# Patient Record
Sex: Male | Born: 1975 | Race: Black or African American | Hispanic: No | Smoking: Former smoker
Health system: Southern US, Community
[De-identification: ages and names within clinical notes are randomized; demographics above are authoritative.]

## PROBLEM LIST (undated history)

## (undated) ENCOUNTER — Ambulatory Visit: Admission: EM

## (undated) ENCOUNTER — Emergency Department (HOSPITAL_COMMUNITY): Admission: EM | Payer: Self-pay

## (undated) ENCOUNTER — Emergency Department (HOSPITAL_COMMUNITY): Payer: No Typology Code available for payment source

## (undated) ENCOUNTER — Emergency Department

## (undated) DIAGNOSIS — I4891 Unspecified atrial fibrillation: Secondary | ICD-10-CM

## (undated) HISTORY — PX: ANKLE SURGERY: SHX546

---

## 1998-01-29 ENCOUNTER — Emergency Department (HOSPITAL_COMMUNITY): Admission: EM | Admit: 1998-01-29 | Discharge: 1998-01-29 | Payer: Self-pay | Admitting: Emergency Medicine

## 1998-09-22 ENCOUNTER — Emergency Department (HOSPITAL_COMMUNITY): Admission: EM | Admit: 1998-09-22 | Discharge: 1998-09-22 | Payer: Self-pay | Admitting: Emergency Medicine

## 1998-09-22 ENCOUNTER — Encounter: Payer: Self-pay | Admitting: Internal Medicine

## 1998-09-22 ENCOUNTER — Emergency Department (HOSPITAL_COMMUNITY): Admission: EM | Admit: 1998-09-22 | Discharge: 1998-09-22 | Payer: Self-pay | Admitting: Internal Medicine

## 1998-09-29 ENCOUNTER — Emergency Department (HOSPITAL_COMMUNITY): Admission: EM | Admit: 1998-09-29 | Discharge: 1998-09-29 | Payer: Self-pay | Admitting: Emergency Medicine

## 2001-07-25 ENCOUNTER — Emergency Department (HOSPITAL_COMMUNITY): Admission: EM | Admit: 2001-07-25 | Discharge: 2001-07-25 | Payer: Self-pay | Admitting: Emergency Medicine

## 2006-09-27 ENCOUNTER — Emergency Department (HOSPITAL_COMMUNITY): Admission: EM | Admit: 2006-09-27 | Discharge: 2006-09-27 | Payer: Self-pay | Admitting: Emergency Medicine

## 2006-09-29 ENCOUNTER — Emergency Department (HOSPITAL_COMMUNITY): Admission: EM | Admit: 2006-09-29 | Discharge: 2006-09-29 | Payer: Self-pay | Admitting: Emergency Medicine

## 2012-03-09 ENCOUNTER — Emergency Department (HOSPITAL_COMMUNITY)
Admission: EM | Admit: 2012-03-09 | Discharge: 2012-03-09 | Disposition: A | Discharge status: Home / Self Care | Payer: Self-pay | Attending: Emergency Medicine | Admitting: Emergency Medicine

## 2012-03-09 ENCOUNTER — Encounter (HOSPITAL_COMMUNITY): Payer: Self-pay

## 2012-03-09 DIAGNOSIS — M545 Low back pain, unspecified: Secondary | ICD-10-CM | POA: Insufficient documentation

## 2012-03-09 DIAGNOSIS — X500XXA Overexertion from strenuous movement or load, initial encounter: Secondary | ICD-10-CM | POA: Insufficient documentation

## 2012-03-09 DIAGNOSIS — F17200 Nicotine dependence, unspecified, uncomplicated: Secondary | ICD-10-CM | POA: Insufficient documentation

## 2012-03-09 MED ORDER — OXYCODONE-ACETAMINOPHEN 5-325 MG PO TABS: 1.0000 | ORAL_TABLET | ORAL | Status: AC | PRN

## 2012-03-09 MED ORDER — CYCLOBENZAPRINE HCL 10 MG PO TABS
5.0000 mg | ORAL_TABLET | Freq: Once | ORAL | Status: AC
  Administered 2012-03-09: 5 mg via ORAL
  Filled 2012-03-09: qty 1

## 2012-03-09 MED ORDER — CYCLOBENZAPRINE HCL 10 MG PO TABS: 10.0000 mg | ORAL_TABLET | Freq: Two times a day (BID) | ORAL | Status: AC | PRN

## 2012-03-09 MED ORDER — OXYCODONE-ACETAMINOPHEN 5-325 MG PO TABS
1.0000 | ORAL_TABLET | Freq: Once | ORAL | Status: AC
  Administered 2012-03-09: 1 via ORAL
  Filled 2012-03-09: qty 1

## 2012-03-09 NOTE — ED Provider Notes (Signed)
History     CSN: [id number]  Arrival date & time 03/09/12  1204   First MD Initiated Contact with Patient 03/09/12 1247      Chief Complaint  Patient presents with  . Back Pain    (Consider location/radiation/quality/duration/timing/severity/associated sxs/prior treatment) HPI  Patient was lifting some heavy boxes and then later in the afternoon noticed later that afternoon that he was really sore. He states that he had a hard time getting comfortable to sleep last night and then this morning his back was hurting much work and his job sent him home to rest. He denies having urinary or bowel incontinence. Denies numbness or tingling in the extremities. He is ambulatory. He is in NAD and VSS.  History reviewed. No pertinent past medical history.  Past Surgical History  Procedure Date  . Ankle surgery     History reviewed. No pertinent family history.  History  Substance Use Topics  . Smoking status: Current Everyday Smoker    Types: Cigarettes  . Smokeless tobacco: Never Used  . Alcohol Use: Yes     socially      Review of Systems   HEENT: denies blurry vision or change in hearing PULMONARY: Denies difficulty breathing and SOB CARDIAC: denies chest pain or heart palpitations MUSCULOSKELETAL:  denies being unable to ambulate ABDOMEN AL: denies abdominal pain GU: denies loss of bowel or urinary control NEURO: denies numbness and tingling in extremities SKIN: no new rashes PSYCH: patient behavior is normal NECK: Not complaining of neck pain     Allergies  Review of patient's allergies indicates no known allergies.  Home Medications   Current Outpatient Rx  Name Route Sig Dispense Refill  . CYCLOBENZAPRINE HCL 10 MG PO TABS Oral Take 1 tablet (10 mg total) by mouth 2 (two) times daily as needed for muscle spasms. 20 tablet 0  . OXYCODONE-ACETAMINOPHEN 5-325 MG PO TABS Oral Take 1 tablet by mouth every 4 (four) hours as needed for pain. 14 tablet 0    BP  135/81  Pulse 99  Temp 98.4 F (36.9 C) (Oral)  Resp 17  SpO2 96%  Physical Exam  Nursing note and vitals reviewed. Constitutional: He appears well-developed and well-nourished. No distress.  HENT:  Head: Normocephalic and atraumatic.  Eyes: Pupils are equal, round, and reactive to light.  Neck: Normal range of motion. Neck supple.  Cardiovascular: Normal rate and regular rhythm.   Pulmonary/Chest: Effort normal.  Abdominal: Soft.  Musculoskeletal:       Arms:       Equal strength to bilateral lower extremities. Neurosensory  function adequate to both legs. Skin color is normal. Skin is warm and moist. I see no step off deformity, no bony tenderness. Pt is able to ambulate without limp. Pain is relieved when sitting in certain positions. ROM is decreased due to pain. No crepitus, laceration, effusion, swelling.  Pulses are normal   Neurological: He is alert.  Skin: Skin is warm and dry.    ED Course  Procedures (including critical care time)  Labs Reviewed - No data to display No results found.   1. Low back pain       MDM  Patient with back pain. No neurological deficits. Patient is ambulatory. No warning symptoms of back pain including: loss of bowel or bladder control, night sweats, waking from sleep with back pain, unexplained fevers or weight loss, h/o cancer, IVDU, recent trauma. No concern for cauda equina, epidural abscess, or other serious cause of back  pain. Conservative measures such as rest, ice/heat and pain medicine indicated with PCP follow-up if no improvement with conservative management.           Dorthula Matas, PA 03/09/12 1313

## 2012-03-09 NOTE — ED Notes (Signed)
Patient c/o lower back pain tht beagan last night. Patient states he had done heavy lifting a couple of days ago, but no injuries. MAE.

## 2012-03-09 NOTE — Discharge Instructions (Signed)
Back Exercises Back exercises help treat and prevent back injuries. The goal of back exercises is to increase the strength of your abdominal and back muscles and the flexibility of your back. These exercises should be started when you no longer have back pain. Back exercises include:  Pelvic Tilt. Lie on your back with your knees bent. Tilt your pelvis until the lower part of your back is against the floor. Hold this position 5 to 10 sec and repeat 5 to 10 times.   Knee to Chest. Pull first 1 knee up against your chest and hold for 20 to 30 seconds, repeat this with the other knee, and then both knees. This may be done with the other leg straight or bent, whichever feels better.   Sit-Ups or Curl-Ups. Bend your knees 90 degrees. Start with tilting your pelvis, and do a partial, slow sit-up, lifting your trunk only 30 to 45 degrees off the floor. Take at least 2 to 3 seconds for each sit-up. Do not do sit-ups with your knees out straight. If partial sit-ups are difficult, simply do the above but with only tightening your abdominal muscles and holding it as directed.   Hip-Lift. Lie on your back with your knees flexed 90 degrees. Push down with your feet and shoulders as you raise your hips a couple inches off the floor; hold for 10 seconds, repeat 5 to 10 times.   Back arches. Lie on your stomach, propping yourself up on bent elbows. Slowly press on your hands, causing an arch in your low back. Repeat 3 to 5 times. Any initial stiffness and discomfort should lessen with repetition over time.   Shoulder-Lifts. Lie face down with arms beside your body. Keep hips and torso pressed to floor as you slowly lift your head and shoulders off the floor.  Do not overdo your exercises, especially in the beginning. Exercises may cause you some mild back discomfort which lasts for a few minutes; however, if the pain is more severe, or lasts for more than 15 minutes, do not continue exercises until you see your  caregiver. Improvement with exercise therapy for back problems is slow.  See your caregivers for assistance with developing a proper back exercise program. Document Released: 10/20/2004 Document Revised: 09/01/2011 Document Reviewed: 09/12/2005 [employer] Patient Information 2012 [employer].Back Pain, Adult Low back pain is very common. About 1 in 5 people have back pain.The cause of low back pain is rarely dangerous. The pain often gets better over time.About half of people with a sudden onset of back pain feel better in just 2 weeks. About 8 in 10 people feel better by 6 weeks.  CAUSES Some common causes of back pain include:  Strain of the muscles or ligaments supporting the spine.   Wear and tear (degeneration) of the spinal discs.   Arthritis.   Direct injury to the back.  DIAGNOSIS Most of the time, the direct cause of low back pain is not known.However, back pain can be treated effectively even when the exact cause of the pain is unknown.Answering your caregiver's questions about your overall health and symptoms is one of the most accurate ways to make sure the cause of your pain is not dangerous. If your caregiver needs more information, he or she may order lab work or imaging tests (X-rays or MRIs).However, even if imaging tests show changes in your back, this usually does not require surgery. HOME CARE INSTRUCTIONS For many people, back pain returns.Since low back pain is rarely   dangerous, it is often a condition that people can learn to manageon their own.   Remain active. It is stressful on the back to sit or stand in one place. Do not sit, drive, or stand in one place for more than 30 minutes at a time. Take short walks on level surfaces as soon as pain allows.Try to increase the length of time you walk each day.   Do not stay in bed.Resting more than 1 or 2 days can delay your recovery.   Do not avoid exercise or work.Your body is made to move.It is not dangerous  to be active, even though your back may hurt.Your back will likely heal faster if you return to being active before your pain is gone.   Pay attention to your body when you bend and lift. Many people have less discomfortwhen lifting if they bend their knees, keep the load close to their bodies,and avoid twisting. Often, the most comfortable positions are those that put less stress on your recovering back.   Find a comfortable position to sleep. Use a firm mattress and lie on your side with your knees slightly bent. If you lie on your back, put a pillow under your knees.   Only take over-the-counter or prescription medicines as directed by your caregiver. Over-the-counter medicines to reduce pain and inflammation are often the most helpful.Your caregiver may prescribe muscle relaxant drugs.These medicines help dull your pain so you can more quickly return to your normal activities and healthy exercise.   Put ice on the injured area.   Put ice in a plastic bag.   Place a towel between your skin and the bag.   Leave the ice on for 15 to 20 minutes, 3 to 4 times a day for the first 2 to 3 days. After that, ice and heat may be alternated to reduce pain and spasms.   Ask your caregiver about trying back exercises and gentle massage. This may be of some benefit.   Avoid feeling anxious or stressed.Stress increases muscle tension and can worsen back pain.It is important to recognize when you are anxious or stressed and learn ways to manage it.Exercise is a great option.  SEEK MEDICAL CARE IF:  You have pain that is not relieved with rest or medicine.   You have pain that does not improve in 1 week.   You have new symptoms.   You are generally not feeling well.  SEEK IMMEDIATE MEDICAL CARE IF:   You have pain that radiates from your back into your legs.   You develop new bowel or bladder control problems.   You have unusual weakness or numbness in your arms or legs.   You develop  nausea or vomiting.   You develop abdominal pain.   You feel faint.  Document Released: 09/12/2005 Document Revised: 09/01/2011 Document Reviewed: 01/31/2011 [employer] Patient Information 2012 [employer].Lumbosacral Strain Lumbosacral strain is one of the most common causes of back pain. There are many causes of back pain. Most are not serious conditions. CAUSES  Your backbone (spinal column) is made up of 24 main vertebral bodies, the sacrum, and the coccyx. These are held together by muscles and tough, fibrous tissue (ligaments). Nerve roots pass through the openings between the vertebrae. A sudden move or injury to the back may cause injury to, or pressure on, these nerves. This may result in localized back pain or pain movement (radiation) into the buttocks, down the leg, and into the foot. Sharp, shooting pain   from the buttock down the back of the leg (sciatica) is frequently associated with a ruptured (herniated) disk. Pain may be caused by muscle spasm alone. Your caregiver can often find the cause of your pain by the details of your symptoms and an exam. In some cases, you may need tests (such as X-rays). Your caregiver will work with you to decide if any tests are needed based on your specific exam. HOME CARE INSTRUCTIONS   Avoid an underactive lifestyle. Active exercise, as directed by your caregiver, is your greatest weapon against back pain.   Avoid hard physical activities (tennis, racquetball, waterskiing) if you are not in proper physical condition for it. This may aggravate or create problems.   If you have a back problem, avoid sports requiring sudden body movements. Swimming and walking are generally safer activities.   Maintain good posture.   Avoid becoming overweight (obese).   Use bed rest for only the most extreme, sudden (acute) episode. Your caregiver will help you determine how much bed rest is necessary.   For acute conditions, you may put ice on the injured  area.   Put ice in a plastic bag.   Place a towel between your skin and the bag.   Leave the ice on for 15 to 20 minutes at a time, every 2 hours, or as needed.   After you are improved and more active, it may help to apply heat for 30 minutes before activities.  See your caregiver if you are having pain that lasts longer than expected. Your caregiver can advise appropriate exercises or therapy if needed. With conditioning, most back problems can be avoided. SEEK IMMEDIATE MEDICAL CARE IF:   You have numbness, tingling, weakness, or problems with the use of your arms or legs.   You experience severe back pain not relieved with medicines.   There is a change in bowel or bladder control.   You have increasing pain in any area of the body, including your belly (abdomen).   You notice shortness of breath, dizziness, or feel faint.   You feel sick to your stomach (nauseous), are throwing up (vomiting), or become sweaty.   You notice discoloration of your toes or legs, or your feet get very cold.   Your back pain is getting worse.   You have a fever.  MAKE SURE YOU:   Understand these instructions.   Will watch your condition.   Will get help right away if you are not doing well or get worse.  Document Released: 06/22/2005 Document Revised: 09/01/2011 Document Reviewed: 12/12/2008 [employer] Patient Information 2012 [employer]. 

## 2012-03-10 NOTE — ED Provider Notes (Signed)
Medical screening examination/treatment/procedure(s) were performed by non-physician practitioner and as supervising physician I was immediately available for consultation/collaboration.  Juliet Rude. Rubin Payor, MD 03/10/12 [phone]

## 2012-10-03 ENCOUNTER — Encounter (HOSPITAL_COMMUNITY): Payer: Self-pay | Admitting: *Deleted

## 2012-10-03 ENCOUNTER — Emergency Department (INDEPENDENT_AMBULATORY_CARE_PROVIDER_SITE_OTHER)
Admission: EM | Admit: 2012-10-03 | Discharge: 2012-10-03 | Disposition: A | Discharge status: Home / Self Care | Payer: Self-pay | Source: Home / Self Care | Attending: Emergency Medicine | Admitting: Emergency Medicine

## 2012-10-03 DIAGNOSIS — B349 Viral infection, unspecified: Secondary | ICD-10-CM

## 2012-10-03 DIAGNOSIS — B9789 Other viral agents as the cause of diseases classified elsewhere: Secondary | ICD-10-CM

## 2012-10-03 MED ORDER — ACETAMINOPHEN 325 MG PO TABS
975.0000 mg | ORAL_TABLET | Freq: Once | ORAL | Status: AC
  Administered 2012-10-03: 975 mg via ORAL

## 2012-10-03 MED ORDER — ACETAMINOPHEN 500 MG PO TABS: 500.0000 mg | ORAL_TABLET | Freq: Four times a day (QID) | ORAL | Status: DC | PRN

## 2012-10-03 MED ORDER — ONDANSETRON HCL 4 MG PO TABS: 4.0000 mg | ORAL_TABLET | Freq: Three times a day (TID) | ORAL | Status: DC | PRN

## 2012-10-03 MED ORDER — ACETAMINOPHEN 325 MG PO TABS
ORAL_TABLET | ORAL | Status: AC
  Filled 2012-10-03: qty 3

## 2012-10-03 NOTE — ED Notes (Signed)
No vomiting while he was here.  Said he went to the [facility] and had a small amount of diarrhea after taking the Tylenol.

## 2012-10-03 NOTE — ED Notes (Signed)
Pt refused blood work  

## 2012-10-03 NOTE — ED Notes (Signed)
C/o head is woozy, diarrhea and vomting onset this AM.  States the back of his legs are cold (chills).  Pt. is febrile.  V x 1 and D x 1. C/o headache and " weak stomach" (nausea).  No cough.

## 2012-10-03 NOTE — ED Provider Notes (Signed)
andHistory     CSN: [id number]  Arrival date & time 10/03/12  1630   First MD Initiated Contact with Patient 10/03/12 1640      Chief Complaint  Patient presents with  . Influenza    (Consider location/radiation/quality/duration/timing/severity/associated sxs/prior treatment) HPI Comments: Patient presents urgent care describing that his head feels woozy has been having diarrheas and vomiting since this a.m. Patient had vomited once and had had to 3 episodes of diarrhea. He described both of his legs have been hurting and feel cold. Patient denies any cough or upper congestion, and no shortness of breath.  " I just feel weak and my legs are hurting"  Patient at this moment denies any abdominal pain, denies feeling nauseous. Does have a headache he describes.  Patient is a 37 y.o. male presenting with flu symptoms. The history is provided by the patient.  Influenza This is a new problem. The current episode started 6 to 12 hours ago. The problem occurs constantly. The problem has not changed since onset.Associated symptoms include headaches. Pertinent negatives include no chest pain, no abdominal pain and no shortness of breath. Nothing relieves the symptoms. Treatments tried: Took a cough syrup last night, and also took an antibiotic dose ( unknown leftover)    History reviewed. No pertinent past medical history.  Past Surgical History  Procedure Date  . Ankle surgery     History reviewed. No pertinent family history.  History  Substance Use Topics  . Smoking status: Current Every Day Smoker -- 0.5 packs/day    Types: Cigarettes  . Smokeless tobacco: Never Used  . Alcohol Use: 3.0 oz/week    5 Shots of liquor per week     Comment: socially      Review of Systems  Constitutional: Positive for fever, chills, diaphoresis, activity change, appetite change and fatigue. Negative for unexpected weight change.  HENT: Negative for congestion, facial swelling, rhinorrhea, neck  pain and neck stiffness.   Eyes: Negative for photophobia and visual disturbance.  Respiratory: Negative for shortness of breath.   Cardiovascular: Negative for chest pain.  Gastrointestinal: Positive for nausea and vomiting. Negative for abdominal pain.  Genitourinary: Negative for dysuria.  Skin: Negative for rash.  Neurological: Positive for weakness, light-headedness and headaches. Negative for tremors, seizures, syncope, speech difficulty and numbness.    Allergies  Review of patient's allergies indicates no known allergies.  Home Medications   Current Outpatient Rx  Name  Route  Sig  Dispense  Refill  . ACETAMINOPHEN 500 MG PO TABS   Oral   Take 1 tablet (500 mg total) by mouth every 6 (six) hours as needed for pain.   15 tablet   0   . ONDANSETRON HCL 4 MG PO TABS   Oral   Take 1 tablet (4 mg total) by mouth every 8 (eight) hours as needed for nausea.   10 tablet   0     BP 123/75  Pulse 112  Temp 100.6 F (38.1 C) (Oral)  Resp 20  SpO2 98%  Physical Exam  Constitutional: Vital signs are normal.  Non-toxic appearance. He does not have a sickly appearance. He does not appear ill. No distress.  HENT:  Head: Normocephalic.  Mouth/Throat: Uvula is midline, oropharynx is clear and moist and mucous membranes are normal.  Eyes: Conjunctivae normal are normal. Right eye exhibits no discharge.  Pulmonary/Chest: Effort normal and breath sounds normal. He has no decreased breath sounds.  Abdominal: Soft. He exhibits no distension and  no mass. There is no tenderness. There is no rebound and no guarding.  Neurological: He is alert.  Skin: Skin is warm. No rash noted. He is not diaphoretic. No erythema. No pallor.    ED Course  Procedures (including critical care time)  Labs Reviewed - No data to display No results found.   1. Viral syndrome     EKG sinus tachycardia of 111. No ST or T wave changes. Normal PR interval and QRS duration.  MDM  Patient with mild  gastrointestinal symptoms. It looks comfortable mild tachycardia. Low-grade temperature. Patient looks comfortable in no respiratory distress soft abdomen well hydrated we'll pursue treatment as a viral syndrome have encouraged patient to  to return if new or worsening symptoms. Patient refused blood work while at urgent care. Patient was prescribed Tylenol and Zofran for nausea vomiting.   Jimmie Molly, MD 10/03/12 [phone]

## 2013-04-06 ENCOUNTER — Encounter (HOSPITAL_COMMUNITY): Payer: Self-pay | Admitting: *Deleted

## 2013-04-06 ENCOUNTER — Emergency Department (HOSPITAL_COMMUNITY)
Admission: EM | Admit: 2013-04-06 | Discharge: 2013-04-06 | Disposition: A | Discharge status: Home / Self Care | Payer: Self-pay | Attending: Emergency Medicine | Admitting: Emergency Medicine

## 2013-04-06 DIAGNOSIS — F17200 Nicotine dependence, unspecified, uncomplicated: Secondary | ICD-10-CM | POA: Insufficient documentation

## 2013-04-06 DIAGNOSIS — R1031 Right lower quadrant pain: Secondary | ICD-10-CM

## 2013-04-06 DIAGNOSIS — R109 Unspecified abdominal pain: Secondary | ICD-10-CM | POA: Insufficient documentation

## 2013-04-06 LAB — CBC WITH DIFFERENTIAL/PLATELET
Eosinophils Absolute: 0.2 10*3/uL (ref 0.0–0.7)
Eosinophils Relative: 2 % (ref 0–5)
Lymphs Abs: 3.2 10*3/uL (ref 0.7–4.0)
MCH: 31.9 pg (ref 26.0–34.0)
MCV: 87.9 fL (ref 78.0–100.0)
Platelets: 219 10*3/uL (ref 150–400)
RBC: 5.04 MIL/uL (ref 4.22–5.81)
RDW: 13.6 % (ref 11.5–15.5)

## 2013-04-06 LAB — COMPREHENSIVE METABOLIC PANEL
ALT: 41 U/L (ref 0–53)
Calcium: 9.1 mg/dL (ref 8.4–10.5)
Creatinine, Ser: 1.36 mg/dL — ABNORMAL HIGH (ref 0.50–1.35)
GFR calc Af Amer: 76 mL/min — ABNORMAL LOW (ref >90)
Glucose, Bld: 89 mg/dL (ref 70–99)
Sodium: 139 mEq/L (ref 135–145)
Total Protein: 7.3 g/dL (ref 6.0–8.3)

## 2013-04-06 NOTE — ED Provider Notes (Signed)
Medical screening examination/treatment/procedure(s) were performed by non-physician practitioner and as supervising physician I was immediately available for consultation/collaboration.  Jones Skene, M.D.     Jones Skene, MD 04/06/13 [phone]

## 2013-04-06 NOTE — ED Notes (Signed)
Pt states that he has been running errands all day and when he got home to rest he sat down and felt a sharp pain 8/10 in his right lower quadrant.pt states that he laid down and he saw a cramp in his stomach move with the pain and then the pain went away. Pt states when he presses on the area the pain gets worse and when he bends forward the pain get worse. Pt states that if he sits still the pain decreases to a 2/10.

## 2013-04-06 NOTE — ED Provider Notes (Signed)
History    CSN: [id number] Arrival date & time 04/06/13  0016  First MD Initiated Contact with Patient 04/06/13 0052     Chief Complaint  Patient presents with  . Abdominal Pain   HPI  History provided by the patient. Patient is a 37 year old male with no significant PMH who presents with complaints of right lower abdominal pains. Patient states that he first began to have some pains and discomforts 2 days ago after helping a friend move from his apartment. Patient did not think much of his symptoms as they were very intermittent and mild. Today patient states he was very busy and running several errands to plan for his daughter's one-year birthday. While bending over to the left side and picking up some of the supplies for an inflatable jump around the patient had sudden sharp pain to his right lower abdomen and side. This caused him to suddenly gasp for air and he had to wait several minutes for pain to improve. Since that time he has had pain anytime he moves at the waist area. Pain is much better while sitting and resting. He has not used any treatment for the symptoms. Pain does not radiate. He denies any weakness numbness in extremities. No back pain. No urinary or fecal incontinence.   History reviewed. No pertinent past medical history. Past Surgical History  Procedure Laterality Date  . Ankle surgery     History reviewed. No pertinent family history. History  Substance Use Topics  . Smoking status: Current Every Day Smoker -- 0.50 packs/day    Types: Cigarettes  . Smokeless tobacco: Never Used  . Alcohol Use: 3.0 oz/week    5 Shots of liquor per week     Comment: socially    Review of Systems  Constitutional: Negative for fever, chills, diaphoresis and appetite change.  Respiratory: Negative for shortness of breath.   Cardiovascular: Negative for chest pain.  Gastrointestinal: Positive for abdominal pain. Negative for nausea, vomiting, diarrhea and constipation.    Genitourinary: Negative for dysuria, frequency, hematuria and flank pain.  Musculoskeletal: Negative for back pain.  All other systems reviewed and are negative.    Allergies  Review of patient's allergies indicates no known allergies.  Home Medications   Current Outpatient Rx  Name  Route  Sig  Dispense  Refill  . acetaminophen (TYLENOL) 500 MG tablet   Oral   Take 1 tablet (500 mg total) by mouth every 6 (six) hours as needed for pain.   15 tablet   0   . ondansetron (ZOFRAN) 4 MG tablet   Oral   Take 1 tablet (4 mg total) by mouth every 8 (eight) hours as needed for nausea.   10 tablet   0    BP 133/90  Pulse 92  Temp(Src) 98.5 F (36.9 C) (Oral)  Resp 16  SpO2 96% Physical Exam  Nursing note and vitals reviewed. Constitutional: He is oriented to person, place, and time. He appears well-developed and well-nourished. No distress.  HENT:  Head: Normocephalic.  Eyes: Conjunctivae are normal.  Cardiovascular: Normal rate and regular rhythm.   No murmur heard. Pulmonary/Chest: Effort normal and breath sounds normal. No respiratory distress.  Abdominal: Soft. There is tenderness in the right upper quadrant. There is no rigidity, no rebound, no guarding, no CVA tenderness, no tenderness at McBurney's point and negative Murphy's sign. No hernia.    Pain along the right lower abdominal wall. There is no deformities. Pain does extend into the anterior  iliac crest area. No gross deformities. No concerning hernias. Skin is normal.  Musculoskeletal: Normal range of motion.  Neurological: He is alert and oriented to person, place, and time.  Skin: Skin is warm. No rash noted. No erythema.  Psychiatric: He has a normal mood and affect. His behavior is normal.    ED Course  Procedures   Results for orders placed during the hospital encounter of 04/06/13  CBC WITH DIFFERENTIAL      Result Value Range   WBC 6.6  4.0 - 10.5 K/uL   RBC 5.04  4.22 - 5.81 MIL/uL   Hemoglobin  16.1  13.0 - 17.0 g/dL   HCT 09.8  11.9 - 14.7 %   MCV 87.9  78.0 - 100.0 fL   MCH 31.9  26.0 - 34.0 pg   MCHC 36.3 (*) 30.0 - 36.0 g/dL   RDW 82.9  56.2 - 13.0 %   Platelets 219  150 - 400 K/uL   Neutrophils Relative % 40 (*) 43 - 77 %   Neutro Abs 2.7  1.7 - 7.7 K/uL   Lymphocytes Relative 49 (*) 12 - 46 %   Lymphs Abs 3.2  0.7 - 4.0 K/uL   Monocytes Relative 8  3 - 12 %   Monocytes Absolute 0.5  0.1 - 1.0 K/uL   Eosinophils Relative 2  0 - 5 %   Eosinophils Absolute 0.2  0.0 - 0.7 K/uL   Basophils Relative 1  0 - 1 %   Basophils Absolute 0.0  0.0 - 0.1 K/uL  COMPREHENSIVE METABOLIC PANEL      Result Value Range   Sodium 139  135 - 145 mEq/L   Potassium 3.6  3.5 - 5.1 mEq/L   Chloride 102  96 - 112 mEq/L   CO2 26  19 - 32 mEq/L   Glucose, Bld 89  70 - 99 mg/dL   BUN 21  6 - 23 mg/dL   Creatinine, Ser 8.65 (*) 0.50 - 1.35 mg/dL   Calcium 9.1  8.4 - 78.4 mg/dL   Total Protein 7.3  6.0 - 8.3 g/dL   Albumin 4.2  3.5 - 5.2 g/dL   AST 36  0 - 37 U/L   ALT 41  0 - 53 U/L   Alkaline Phosphatase 63  39 - 117 U/L   Total Bilirubin 0.5  0.3 - 1.2 mg/dL   GFR calc non Af Amer 65 (*) >90 mL/min   GFR calc Af Amer 76 (*) >90 mL/min        1. Abdominal wall pain in right lower quadrant     MDM  1:00AM patient seen and evaluated. Patient appears in no acute distress.  Patient with right lower abdominal pains worse with movements and flexion of abdominal wall muscles. He has normal WBC. No fever. He has no pain at rest. At this time I have low clinical suspicion for acute appendicitis. I did discuss this with the patient is a possibility however at this time he does not wish to have any further testing or CAT scan. He prefers to monitor symptoms at home and return if needed for continuous worsened pain. At this time we'll recommend conservative treatments.  Angus Seller, PA-C 04/06/13 [phone]

## 2014-02-16 ENCOUNTER — Emergency Department (HOSPITAL_COMMUNITY)
Admission: EM | Admit: 2014-02-16 | Discharge: 2014-02-16 | Disposition: A | Discharge status: Home / Self Care | Payer: Self-pay | Attending: Emergency Medicine | Admitting: Emergency Medicine

## 2014-02-16 ENCOUNTER — Encounter (HOSPITAL_COMMUNITY): Payer: Self-pay | Admitting: Emergency Medicine

## 2014-02-16 DIAGNOSIS — M7981 Nontraumatic hematoma of soft tissue: Secondary | ICD-10-CM | POA: Insufficient documentation

## 2014-02-16 DIAGNOSIS — T148XXA Other injury of unspecified body region, initial encounter: Secondary | ICD-10-CM

## 2014-02-16 DIAGNOSIS — F17200 Nicotine dependence, unspecified, uncomplicated: Secondary | ICD-10-CM | POA: Insufficient documentation

## 2014-02-16 NOTE — ED Notes (Signed)
Pt states he awoke tonight with lower left jaw pain.  Hematoma noted not the inside of pt left cheek

## 2014-02-16 NOTE — ED Provider Notes (Signed)
CSN: [id number]     Arrival date & time 02/16/14  7169 History   First MD Initiated Contact with Patient 02/16/14 0542     Chief Complaint  Patient presents with  . Dental Pain      HPI  Patient awakened early this morning with discomfort in his left cheek. He looked in the mirror  and inside of his mouth he saw a large swollen purple area. He became concerned that this might be an infection.  History reviewed. No pertinent past medical history. Past Surgical History  Procedure Laterality Date  . Ankle surgery     No family history on file. History  Substance Use Topics  . Smoking status: Current Every Day Smoker -- 0.50 packs/day    Types: Cigarettes  . Smokeless tobacco: Never Used  . Alcohol Use: 3.0 oz/week    5 Shots of liquor per week     Comment: socially    Review of Systems  HENT:       Pain in the inside of his left cheek. No pain or swelling over the parotid gland. No swelling to the floor of the mouth. No neck swelling. No dental pain.      Allergies  Review of patient's allergies indicates no known allergies.  Home Medications   Prior to Admission medications   Not on File   BP 136/91  Pulse 107  Temp(Src) 98.1 F (36.7 C) (Oral)  Resp 18  Ht 6\' 1"  (1.854 m)  Wt 220 lb (99.791 kg)  BMI 29.03 kg/m2  SpO2 98% Physical Exam  Constitutional:  Adult male upright in no acute distress.  HENT:  Mouth/Throat:    Neck:  No swelling or induration to the neck. No adenopathy in the neck.    ED Course  Procedures (including critical care time) Labs Review Labs Reviewed - No data to display  Imaging Review No results found.   EKG Interpretation None      MDM   Final diagnoses:  Hematoma    No specific treatment noted. Advised him not to consume any food today that he has to chew. Salt water or ice water swish and spit.    Tanna Furry, MD 02/16/14 [phone]

## 2014-02-16 NOTE — Discharge Instructions (Signed)
Avoid food that you have to chew until hematoma is smaller.  Ice water swish and spit.

## 2014-05-09 ENCOUNTER — Emergency Department (HOSPITAL_COMMUNITY): Payer: No Typology Code available for payment source

## 2014-05-09 ENCOUNTER — Emergency Department (HOSPITAL_COMMUNITY)
Admission: EM | Admit: 2014-05-09 | Discharge: 2014-05-09 | Disposition: A | Discharge status: Home / Self Care | Payer: No Typology Code available for payment source | Attending: Emergency Medicine | Admitting: Emergency Medicine

## 2014-05-09 DIAGNOSIS — Z79899 Other long term (current) drug therapy: Secondary | ICD-10-CM | POA: Insufficient documentation

## 2014-05-09 DIAGNOSIS — IMO0002 Reserved for concepts with insufficient information to code with codable children: Secondary | ICD-10-CM | POA: Diagnosis present

## 2014-05-09 DIAGNOSIS — IMO0001 Reserved for inherently not codable concepts without codable children: Secondary | ICD-10-CM | POA: Insufficient documentation

## 2014-05-09 DIAGNOSIS — F17200 Nicotine dependence, unspecified, uncomplicated: Secondary | ICD-10-CM | POA: Insufficient documentation

## 2014-05-09 DIAGNOSIS — Y92410 Unspecified street and highway as the place of occurrence of the external cause: Secondary | ICD-10-CM | POA: Insufficient documentation

## 2014-05-09 DIAGNOSIS — Y9389 Activity, other specified: Secondary | ICD-10-CM | POA: Insufficient documentation

## 2014-05-09 MED ORDER — TRAMADOL HCL 50 MG PO TABS
50.0000 mg | ORAL_TABLET | Freq: Once | ORAL | Status: AC
  Administered 2014-05-09: 50 mg via ORAL
  Filled 2014-05-09: qty 1

## 2014-05-09 MED ORDER — METHOCARBAMOL 500 MG PO TABS: 500.0000 mg | ORAL_TABLET | Freq: Two times a day (BID) | ORAL | Status: DC

## 2014-05-09 MED ORDER — TRAMADOL HCL 50 MG PO TABS: 50.0000 mg | ORAL_TABLET | Freq: Four times a day (QID) | ORAL | Status: DC | PRN
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
## 2014-05-09 NOTE — ED Notes (Signed)
Patient transported to X-ray 
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
## 2014-05-11 NOTE — ED Provider Notes (Signed)
Medical screening examination/treatment/procedure(s) were performed by non-physician practitioner and as supervising physician I was immediately available for consultation/collaboration.   EKG Interpretation None       Virgel Manifold, MD 05/11/14 [phone]

## 2016-06-03 ENCOUNTER — Ambulatory Visit (HOSPITAL_COMMUNITY)
Admission: EM | Admit: 2016-06-03 | Discharge: 2016-06-03 | Disposition: A | Discharge status: Home / Self Care | Payer: Self-pay | Attending: Physician Assistant | Admitting: Physician Assistant

## 2016-06-03 ENCOUNTER — Encounter (HOSPITAL_COMMUNITY): Payer: Self-pay | Admitting: Emergency Medicine

## 2016-06-03 DIAGNOSIS — R0789 Other chest pain: Secondary | ICD-10-CM

## 2016-06-03 DIAGNOSIS — T148 Other injury of unspecified body region: Secondary | ICD-10-CM

## 2016-06-03 DIAGNOSIS — T148XXA Other injury of unspecified body region, initial encounter: Secondary | ICD-10-CM

## 2016-06-03 MED ORDER — DICLOFENAC SODIUM 1 % TD GEL: 4.0000 g | Freq: Four times a day (QID) | TRANSDERMAL | 2 refills | Status: DC

## 2016-06-03 MED ORDER — OXYCODONE-ACETAMINOPHEN 10-325 MG PO TABS: 1.0000 | ORAL_TABLET | ORAL | 0 refills | Status: DC | PRN

## 2016-06-03 NOTE — ED Provider Notes (Signed)
CSN: [id number]     Arrival date & time 06/03/16  1211 History   First MD Initiated Contact with Patient 06/03/16 1246     Chief Complaint  Patient presents with  . Chest Pain   (Consider location/radiation/quality/duration/timing/severity/associated sxs/prior Treatment) HPI History obtained from patient:  Pt presents with the cc of:  Left chest wall pain Duration of symptoms: 3 days Treatment prior to arrival: Over-the-counter medications Context: Lifting heavy crates at work felt a muscle pull in his left upper chest Other symptoms include: No other symptoms except pain with motion of the left arm Pain score: 4 are 5 FAMILY HISTORY: Hypertension    History reviewed. No pertinent past medical history. Past Surgical History:  Procedure Laterality Date  . ANKLE SURGERY     History reviewed. No pertinent family history. Social History  Substance Use Topics  . Smoking status: Current Every Day Smoker    Packs/day: 0.50    Types: Cigarettes  . Smokeless tobacco: Never Used  . Alcohol use 3.0 oz/week    5 Shots of liquor per week     Comment: socially    Review of Systems  Denies: HEADACHE, NAUSEA, ABDOMINAL PAIN, CHEST PAIN, CONGESTION, DYSURIA, SHORTNESS OF BREATH  Allergies  Review of patient's allergies indicates no known allergies.  Home Medications   Prior to Admission medications   Medication Sig Start Date End Date Taking? Authorizing Provider  diclofenac sodium (VOLTAREN) 1 % GEL Apply 4 g topically 4 (four) times daily. 06/03/16   Konrad Felix, PA  methocarbamol (ROBAXIN) 500 MG tablet Take 1 tablet (500 mg total) by mouth 2 (two) times daily. 05/09/14   Jennifer Piepenbrink, PA-C  oxyCODONE-acetaminophen (PERCOCET) 10-325 MG tablet Take 1 tablet by mouth every 4 (four) hours as needed for pain. 06/03/16   Konrad Felix, PA  traMADol (ULTRAM) 50 MG tablet Take 1 tablet (50 mg total) by mouth every 6 (six) hours as needed. 05/09/14   Baron Sane, PA-C    Meds Ordered and Administered this Visit  Medications - No data to display  BP 118/91 (BP Location: Left Arm)   Pulse 78   Temp 98 F (36.7 C) (Oral)   Resp 12   SpO2 100%  No data found.   Physical Exam NURSES NOTES AND VITAL SIGNS REVIEWED. CONSTITUTIONAL: Well developed, well nourished, no acute distress HEENT: normocephalic, atraumatic EYES: Conjunctiva normal NECK:normal ROM, supple, no adenopathy PULMONARY:No respiratory distress, normal effort Left chest wall tender to palpation. Increased discomfort with motion of the left shoulder or arm. No changes of decreased breath sounds. ABDOMINAL: Soft, ND, NT BS+, No CVAT MUSCULOSKELETAL: Normal ROM of all extremities,  SKIN: warm and dry without rash PSYCHIATRIC: Mood and affect, behavior are normal  Urgent Care Course   Clinical Course    Procedures (including critical care time)  Labs Review Labs Reviewed - No data to display  Imaging Review No results found.   Visual Acuity Review  Right Eye Distance:   Left Eye Distance:   Bilateral Distance:    Right Eye Near:   Left Eye Near:    Bilateral Near:         MDM   1. Chest wall pain   2. Pulled muscle     Patient is reassured that there are no issues that require transfer to higher level of care at this time or additional tests. Patient is advised to continue home symptomatic treatment. Patient is advised that if there are new or worsening symptoms  to attend the emergency department, contact primary care provider, or return to UC. Instructions of care provided discharged home in stable condition.    THIS NOTE WAS GENERATED USING A VOICE RECOGNITION SOFTWARE PROGRAM. ALL REASONABLE EFFORTS  WERE MADE TO PROOFREAD THIS DOCUMENT FOR ACCURACY.  I have verbally reviewed the discharge instructions with the patient. A printed AVS was given to the patient.  All questions were answered prior to discharge.      Konrad Felix, [place] 06/03/16 1549

## 2016-06-03 NOTE — ED Triage Notes (Signed)
Here for left sided constant CP onset x2 days  Reports pain increases w/activity.... Has been lifting heavy boxes/crates at work  Denies SOB/dyspnea, diaphoresis, weakness, nausea  Smokes 0.5 PPD  States he did not go to work due to pain  A&O x4.. NAD

## 2017-01-16 ENCOUNTER — Encounter (HOSPITAL_COMMUNITY): Payer: Self-pay | Admitting: Emergency Medicine

## 2017-01-16 ENCOUNTER — Emergency Department (HOSPITAL_COMMUNITY)
Admission: EM | Admit: 2017-01-16 | Discharge: 2017-01-16 | Disposition: A | Discharge status: Home / Self Care | Payer: No Typology Code available for payment source | Attending: Emergency Medicine | Admitting: Emergency Medicine

## 2017-01-16 ENCOUNTER — Emergency Department (HOSPITAL_COMMUNITY): Payer: No Typology Code available for payment source

## 2017-01-16 DIAGNOSIS — S301XXA Contusion of abdominal wall, initial encounter: Secondary | ICD-10-CM | POA: Insufficient documentation

## 2017-01-16 DIAGNOSIS — S20211A Contusion of right front wall of thorax, initial encounter: Secondary | ICD-10-CM | POA: Insufficient documentation

## 2017-01-16 DIAGNOSIS — Y92410 Unspecified street and highway as the place of occurrence of the external cause: Secondary | ICD-10-CM | POA: Diagnosis not present

## 2017-01-16 DIAGNOSIS — S299XXA Unspecified injury of thorax, initial encounter: Secondary | ICD-10-CM | POA: Diagnosis present

## 2017-01-16 DIAGNOSIS — Y999 Unspecified external cause status: Secondary | ICD-10-CM | POA: Insufficient documentation

## 2017-01-16 DIAGNOSIS — F17210 Nicotine dependence, cigarettes, uncomplicated: Secondary | ICD-10-CM | POA: Diagnosis not present

## 2017-01-16 DIAGNOSIS — Y939 Activity, unspecified: Secondary | ICD-10-CM | POA: Insufficient documentation

## 2017-01-16 LAB — I-STAT CHEM 8, ED
BUN: 23 mg/dL — ABNORMAL HIGH (ref 6–20)
CREATININE: 1.1 mg/dL (ref 0.61–1.24)
Calcium, Ion: 1.11 mmol/L — ABNORMAL LOW (ref 1.15–1.40)
Chloride: 106 mmol/L (ref 101–111)
GLUCOSE: 83 mg/dL (ref 65–99)
HCT: 48 % (ref 39.0–52.0)
HEMOGLOBIN: 16.3 g/dL (ref 13.0–17.0)
Potassium: 4.4 mmol/L (ref 3.5–5.1)
Sodium: 139 mmol/L (ref 135–145)
TCO2: 29 mmol/L (ref 0–100)

## 2017-01-16 MED ORDER — HYDROCODONE-ACETAMINOPHEN 5-325 MG PO TABS
1.0000 | ORAL_TABLET | Freq: Once | ORAL | Status: AC
  Administered 2017-01-16: 1 via ORAL
  Filled 2017-01-16: qty 1

## 2017-01-16 MED ORDER — MORPHINE SULFATE (PF) 4 MG/ML IV SOLN
4.0000 mg | Freq: Once | INTRAVENOUS | Status: AC
Start: 2017-01-16 — End: 2017-01-16
  Administered 2017-01-16: 4 mg via INTRAVENOUS
  Filled 2017-01-16: qty 1

## 2017-01-16 MED ORDER — CYCLOBENZAPRINE HCL 10 MG PO TABS
10.0000 mg | ORAL_TABLET | Freq: Once | ORAL | Status: AC
  Administered 2017-01-16: 10 mg via ORAL
  Filled 2017-01-16: qty 1

## 2017-01-16 MED ORDER — IOPAMIDOL (ISOVUE-300) INJECTION 61%
INTRAVENOUS | Status: AC
  Administered 2017-01-16: 100 mL
  Filled 2017-01-16: qty 100

## 2017-01-16 MED ORDER — NAPROXEN 375 MG PO TABS: 375.0000 mg | ORAL_TABLET | Freq: Two times a day (BID) | ORAL | 0 refills | Status: DC

## 2017-01-16 MED ORDER — HYDROCODONE-ACETAMINOPHEN 5-325 MG PO TABS: 1.0000 | ORAL_TABLET | Freq: Four times a day (QID) | ORAL | 0 refills | Status: DC | PRN

## 2017-01-16 MED ORDER — ONDANSETRON HCL 4 MG/2ML IJ SOLN
4.0000 mg | Freq: Once | INTRAMUSCULAR | Status: AC
  Administered 2017-01-16: 4 mg via INTRAVENOUS
  Filled 2017-01-16: qty 2

## 2017-01-16 MED ORDER — CYCLOBENZAPRINE HCL 10 MG PO TABS: 10.0000 mg | ORAL_TABLET | Freq: Two times a day (BID) | ORAL | 0 refills | Status: DC | PRN
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
## 2017-01-16 NOTE — ED Notes (Signed)
Patient transported to CT 

## 2017-01-16 NOTE — ED Notes (Signed)
Pt complains of right side rib pain. Pt has full movement of upper and lower extremities.
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
## 2017-01-31 ENCOUNTER — Ambulatory Visit (HOSPITAL_COMMUNITY)
Admission: EM | Admit: 2017-01-31 | Discharge: 2017-01-31 | Disposition: A | Discharge status: Home / Self Care | Payer: Self-pay | Attending: Internal Medicine | Admitting: Internal Medicine

## 2017-01-31 ENCOUNTER — Ambulatory Visit (INDEPENDENT_AMBULATORY_CARE_PROVIDER_SITE_OTHER): Payer: Self-pay

## 2017-01-31 ENCOUNTER — Encounter (HOSPITAL_COMMUNITY): Payer: Self-pay | Admitting: Emergency Medicine

## 2017-01-31 DIAGNOSIS — R0789 Other chest pain: Secondary | ICD-10-CM

## 2017-01-31 DIAGNOSIS — R05 Cough: Secondary | ICD-10-CM | POA: Insufficient documentation

## 2017-01-31 DIAGNOSIS — J90 Pleural effusion, not elsewhere classified: Secondary | ICD-10-CM

## 2017-01-31 NOTE — Discharge Instructions (Signed)
Return if any problems.  Use spirometer to take deep breaths.

## 2017-01-31 NOTE — ED Notes (Signed)
Incentive    Spirometry      Instructed  To  Pt        With  Return   Demonstration
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
## 2019-01-17 ENCOUNTER — Other Ambulatory Visit: Payer: Self-pay

## 2019-01-17 ENCOUNTER — Emergency Department (HOSPITAL_COMMUNITY)
Admission: EM | Admit: 2019-01-17 | Discharge: 2019-01-17 | Disposition: A | Discharge status: Home / Self Care | Payer: Self-pay | Attending: Emergency Medicine | Admitting: Emergency Medicine

## 2019-01-17 DIAGNOSIS — R030 Elevated blood-pressure reading, without diagnosis of hypertension: Secondary | ICD-10-CM | POA: Insufficient documentation

## 2019-01-17 DIAGNOSIS — R6889 Other general symptoms and signs: Secondary | ICD-10-CM

## 2019-01-17 DIAGNOSIS — Z79899 Other long term (current) drug therapy: Secondary | ICD-10-CM | POA: Insufficient documentation

## 2019-01-17 DIAGNOSIS — R05 Cough: Secondary | ICD-10-CM | POA: Insufficient documentation

## 2019-01-17 DIAGNOSIS — F17210 Nicotine dependence, cigarettes, uncomplicated: Secondary | ICD-10-CM | POA: Insufficient documentation

## 2019-01-17 DIAGNOSIS — R6883 Chills (without fever): Secondary | ICD-10-CM | POA: Insufficient documentation

## 2019-01-17 NOTE — ED Triage Notes (Signed)
Pt c/o SOB cough and chills that began last night ; pt denies any chest pain

## 2019-01-17 NOTE — Discharge Instructions (Addendum)
Your symptoms could be related to Covid-19.  We currently do not have tests, that we can do to diagnose the problem.  It is important to get plenty of rest, drink a lot of fluids and treat your symptoms.  You can use Tylenol (acetaminophen) 650 mg every 4 hours as needed for fever.  Watch for progressive or worsening symptoms, and return here as needed.  Your blood pressure was mildly elevated today we recommend that you watch your salt intake, to help lower the blood pressure.  Also try to stop smoking to improve your to feel better.  Follow-up with a primary care doctor for blood pressure check in 1 to 2 months.  You are advised to self quarantine for the duration of your fever, and for 3 days after fever stops, without using fever reducing medication.  See the attached information describing this procedure.

## 2019-01-17 NOTE — ED Provider Notes (Signed)
[facility]PART[facility] Provider Note   CSN: [id number] Arrival date & time: 01/17/19  1243    History   Chief Complaint Chief Complaint  Patient presents with  . Chills    HPI Alfred Wyatt is a 42 y.o. male.     HPI   Presents for evaluation of chills, nonproductive cough which began last night.  He is currently employed.  He did not take his temperature at home.  He denies nausea, vomiting, focal weakness or paresthesia.  He feels generally weak.  He is a cigarette smoker.  No recent illnesses.  No known sick contacts.  No past medical history on file.  There are no active problems to display for this patient.   Past Surgical History:  Procedure Laterality Date  . ANKLE SURGERY          Home Medications    Prior to Admission medications   Medication Sig Start Date End Date Taking? Authorizing Provider  cyclobenzaprine (FLEXERIL) 10 MG tablet Take 1 tablet (10 mg total) by mouth 2 (two) times daily as needed for muscle spasms. 01/16/17   Blanchie Dessert, MD  HYDROcodone-acetaminophen (NORCO/VICODIN) 5-325 MG tablet Take 1-2 tablets by mouth every 6 (six) hours as needed for severe pain. 01/16/17   Blanchie Dessert, MD  naproxen (NAPROSYN) 375 MG tablet Take 1 tablet (375 mg total) by mouth 2 (two) times daily. 01/16/17   Blanchie Dessert, MD    Family History No family history on file.  Social History Social History   Tobacco Use  . Smoking status: Current Every Day Smoker    Packs/day: 0.50    Types: Cigarettes  . Smokeless tobacco: Never Used  Substance Use Topics  . Alcohol use: Yes    Alcohol/week: 5.0 standard drinks    Types: 5 Shots of liquor per week    Comment: socially  . Drug use: No     Allergies   Patient has no known allergies.   Review of Systems Review of Systems  All other systems reviewed and are negative.    Physical Exam Updated Vital Signs BP (!) 161/109   Pulse (!) 109   Temp  100.2 F (37.9 C) (Oral)   Resp 18   Ht 6\' 1"  (1.854 m)   Wt 95.3 kg   SpO2 95%   BMI 27.71 kg/m   Physical Exam Vitals signs and nursing note reviewed.  Constitutional:      General: He is not in acute distress.    Appearance: He is well-developed. He is not ill-appearing, toxic-appearing or diaphoretic.  HENT:     Head: Normocephalic and atraumatic.     Right Ear: External ear normal.     Left Ear: External ear normal.  Eyes:     Conjunctiva/sclera: Conjunctivae normal.  Neck:     Musculoskeletal: Normal range of motion and neck supple.     Trachea: Phonation normal.  Cardiovascular:     Rate and Rhythm: Normal rate.  Pulmonary:     Effort: Pulmonary effort is normal. No respiratory distress.  Musculoskeletal: Normal range of motion.  Skin:    General: Skin is dry.     Coloration: Skin is not jaundiced or pale.  Neurological:     Mental Status: He is alert and oriented to person, place, and time.     Cranial Nerves: No cranial nerve deficit.     Sensory: No sensory deficit.     Motor: No abnormal muscle tone.  Coordination: Coordination normal.  Psychiatric:        Mood and Affect: Mood normal.        Behavior: Behavior normal.        Thought Content: Thought content normal.        Judgment: Judgment normal.      ED Treatments / Results  Labs (all labs ordered are listed, but only abnormal results are displayed) Labs Reviewed - No data to display  EKG None  Radiology No results found.  Procedures Procedures (including critical care time)  Medications Ordered in ED Medications - No data to display   Initial Impression / Assessment and Plan / ED Course  I have reviewed the triage vital signs and the nursing notes.  Pertinent labs & imaging results that were available during my care of the patient were reviewed by me and considered in my medical decision making (see chart for details).         Patient Vitals for the past 24 hrs:  BP Temp  Temp src Pulse Resp SpO2 Height Weight  01/17/19 1250 - - - - - - 6\' 1"  (1.854 m) 95.3 kg  01/17/19 1249 (!) 161/109 100.2 F (37.9 C) Oral (!) 109 18 95 % - -    1:22 PM Reevaluation with update and discussion. After initial assessment and treatment, an updated evaluation reveals no change in clinical status, findings discussed with patient and all questions were answered. Daleen Bo   Medical Decision Making: Acute illness, upper respiratory, possible Covid-19 infection.  He is hemodynamically normal, except for mild hypertension likely pre-existing.  Doubt hypertensive urgency.  Oxygenation is normal. There is no indication for further intervention, at this time.  ISABEL FREESE was evaluated in Emergency Department on 01/17/2019 for the symptoms described in the history of present illness. He was evaluated in the context of the global COVID-19 pandemic, which necessitated consideration that the patient might be at risk for infection with the SARS-CoV-2 virus that causes COVID-19. Institutional protocols and algorithms that pertain to the evaluation of patients at risk for COVID-19 are in a state of rapid change based on information released by regulatory bodies including the CDC and federal and state organizations. These policies and algorithms were followed during the patient's care in the ED.  CRITICAL CARE- No Performed by: Daleen Bo  Nursing Notes Reviewed/ Care Coordinated Applicable Imaging Reviewed Interpretation of Laboratory Data incorporated into ED treatment  The patient appears reasonably screened and/or stabilized for discharge and I doubt any other medical condition or other [facility] requiring further screening, evaluation, or treatment in the ED at this time prior to discharge.  Plan: Home Medications-symptomatic treatments as needed; Home Treatments-low-salt diet, fluids, rest, self quarantine, per usual; return here if the recommended treatment, does not improve the  symptoms; Recommended follow up-PCP follow-up 1 to 2 months for blood pressure check.   Final Clinical Impressions(s) / ED Diagnoses   Final diagnoses:  None    ED Discharge Orders    None       Daleen Bo, MD 01/17/19 1342

## 2020-01-18 ENCOUNTER — Ambulatory Visit: Payer: Self-pay

## 2020-03-05 ENCOUNTER — Emergency Department (HOSPITAL_COMMUNITY): Admission: EM | Admit: 2020-03-05 | Discharge: 2020-03-05 | Discharge status: Against Medical Advice | Payer: Self-pay

## 2020-03-05 ENCOUNTER — Ambulatory Visit
Admission: EM | Admit: 2020-03-05 | Discharge: 2020-03-05 | Disposition: A | Discharge status: Home / Self Care | Payer: Self-pay | Attending: Physician Assistant | Admitting: Physician Assistant

## 2020-03-05 DIAGNOSIS — R059 Cough, unspecified: Secondary | ICD-10-CM

## 2020-03-05 DIAGNOSIS — R05 Cough: Secondary | ICD-10-CM

## 2020-03-05 DIAGNOSIS — R0981 Nasal congestion: Secondary | ICD-10-CM

## 2020-03-05 DIAGNOSIS — M542 Cervicalgia: Secondary | ICD-10-CM

## 2020-03-05 MED ORDER — DICLOFENAC SODIUM 50 MG PO TBEC: 50.0000 mg | DELAYED_RELEASE_TABLET | Freq: Two times a day (BID) | ORAL | 0 refills | Status: DC

## 2020-03-05 MED ORDER — TIZANIDINE HCL 2 MG PO TABS: 2.0000 mg | ORAL_TABLET | Freq: Three times a day (TID) | ORAL | 0 refills | Status: DC | PRN

## 2020-03-05 NOTE — ED Triage Notes (Signed)
Pt states c/o cough and congestion since Monday and coughed up a lot of yellow/brown sputum. States now having severe head/neck pain with pain on movement.

## 2020-03-05 NOTE — Discharge Instructions (Signed)
COVID PCR testing ordered. I would like you to quarantine until testing results. Start diclofenac. Tizanidine as needed, this can make you drowsy, so do not take if you are going to drive, operate heavy machinery, or make important decisions. Ice/heat compresses as needed. If having fever, nausea/vomiting, sensitivity to light, and unable to move neck, go to the ED for further evaluation.

## 2020-03-05 NOTE — ED Provider Notes (Signed)
[name]    CSN: [id number] Arrival date & time: 03/05/20  1834      History   Chief Complaint Chief Complaint  Patient presents with  . Neck Pain    HPI Alfred Wyatt is a 44 y.o. male.   44 year old male comes in for neck pain.  States 3 days ago started having cough, congestion, and then started having neck pain.  Pain is to the posterior head/neck, and is preventing range of motion of the neck.  Denies fever, nausea, vomiting, photophobia.  Denies radiation of pain.  Denies loss of grip strength.  Denies abdominal symptoms, shortness of breath, loss of taste or smell.  Ibuprofen, Tylenol, topical medication without relief.     History reviewed. No pertinent past medical history.  There are no problems to display for this patient.   Past Surgical History:  Procedure Laterality Date  . ANKLE SURGERY         Home Medications    Prior to Admission medications   Medication Sig Start Date End Date Taking? Authorizing Provider  diclofenac (VOLTAREN) 50 MG EC tablet Take 1 tablet (50 mg total) by mouth 2 (two) times daily. 03/05/20   Tasia Catchings, Seema Blum V, PA-C  tiZANidine (ZANAFLEX) 2 MG tablet Take 1 tablet (2 mg total) by mouth every 8 (eight) hours as needed for muscle spasms. 03/05/20   Ok Edwards, PA-C    Family History History reviewed. No pertinent family history.  Social History Social History   Tobacco Use  . Smoking status: Current Every Day Smoker    Packs/day: 0.50    Types: Cigarettes  . Smokeless tobacco: Never Used  Substance Use Topics  . Alcohol use: Yes    Alcohol/week: 5.0 standard drinks    Types: 5 Shots of liquor per week    Comment: socially  . Drug use: No     Allergies   Patient has no known allergies.   Review of Systems Review of Systems  Reason unable to perform ROS: See HPI as above.     Physical Exam Triage Vital Signs ED Triage Vitals [03/05/20 1930]  Enc Vitals Group     BP (!) 141/103     Pulse Rate  87     Resp 18     Temp 98.4 F (36.9 C)     Temp Source Oral     SpO2 98 %     Weight      Height      Head Circumference      Peak Flow      Pain Score 8     Pain Loc      Pain Edu?      Excl. in [address]?    No data found.  Updated Vital Signs BP (!) 136/102 (BP Location: Left Arm)   Pulse 87   Temp 98.4 F (36.9 C) (Oral)   Resp 18   SpO2 98%   Physical Exam Constitutional:      General: He is not in acute distress.    Appearance: Normal appearance. He is not ill-appearing, toxic-appearing or diaphoretic.  HENT:     Head: Normocephalic and atraumatic.     Mouth/Throat:     Mouth: Mucous membranes are moist.     Pharynx: Oropharynx is clear. Uvula midline.  Eyes:     Extraocular Movements: Extraocular movements intact.     Conjunctiva/sclera: Conjunctivae normal.     Pupils: Pupils are equal, round, and reactive to light.  Comments: No photophobia.  Neck:     Meningeal: Brudzinski's sign and Kernig's sign absent.     Comments: Tenderness to palpation of superior neck diffusely without spinous tenderness. No occipital lymphadenopathy.  Cardiovascular:     Rate and Rhythm: Normal rate and regular rhythm.     Heart sounds: Normal heart sounds. No murmur heard.  No friction rub. No gallop.   Pulmonary:     Effort: Pulmonary effort is normal. No accessory muscle usage, prolonged expiration, respiratory distress or retractions.     Comments: Lungs clear to auscultation without adventitious lung sounds. Musculoskeletal:     Cervical back: Normal range of motion and neck supple.  Lymphadenopathy:     Cervical: Cervical adenopathy present.     Right cervical: Superficial cervical adenopathy present.     Left cervical: Superficial cervical adenopathy present.  Neurological:     General: No focal deficit present.     Mental Status: He is alert and oriented to person, place, and time.      UC Treatments / Results  Labs (all labs ordered are listed, but only  abnormal results are displayed) Labs Reviewed  NOVEL CORONAVIRUS, NAA    EKG   Radiology No results found.  Procedures Procedures (including critical care time)  Medications Ordered in UC Medications - No data to display  Initial Impression / Assessment and Plan / UC Course  I have reviewed the triage vital signs and the nursing notes.  Pertinent labs & imaging results that were available during my care of the patient were reviewed by me and considered in my medical decision making (see chart for details).    Covid testing ordered.  Patient to quarantine until testing results return.  Patient afebrile, nontoxic in appearance.  Low suspicion for meningitis at this time given no fever, photophobia, nausea, vomiting.  Still able to move neck, though decreased range of motion.  NSAIDs, muscle relaxants as directed.  Return precautions given.  Patient expresses understanding and agrees to plan.  Final Clinical Impressions(s) / UC Diagnoses   Final diagnoses:  Cough  Nasal congestion  Neck pain   ED Prescriptions    Medication Sig Dispense Auth. Provider   diclofenac (VOLTAREN) 50 MG EC tablet Take 1 tablet (50 mg total) by mouth 2 (two) times daily. 20 tablet Jodell Weitman V, PA-C   tiZANidine (ZANAFLEX) 2 MG tablet Take 1 tablet (2 mg total) by mouth every 8 (eight) hours as needed for muscle spasms. 15 tablet Ok Edwards, PA-C     I have reviewed the PDMP during this encounter.   Ok Edwards, PA-C 03/05/20 2002

## 2020-03-05 NOTE — ED Notes (Signed)
Pts mother, Stanton Kidney, would like to be called once he gets back to a room. [phone]

## 2020-03-07 LAB — SARS-COV-2, NAA 2 DAY TAT

## 2020-03-07 LAB — NOVEL CORONAVIRUS, NAA: SARS-CoV-2, NAA: NOT DETECTED

## 2021-07-07 ENCOUNTER — Emergency Department (HOSPITAL_COMMUNITY)
Admission: EM | Admit: 2021-07-07 | Discharge: 2021-07-07 | Disposition: A | Discharge status: Home / Self Care | Payer: BC Managed Care – PPO | Attending: Emergency Medicine | Admitting: Emergency Medicine

## 2021-07-07 ENCOUNTER — Encounter: Payer: Self-pay | Admitting: Emergency Medicine

## 2021-07-07 ENCOUNTER — Encounter (HOSPITAL_COMMUNITY): Payer: Self-pay | Admitting: Emergency Medicine

## 2021-07-07 ENCOUNTER — Emergency Department (HOSPITAL_COMMUNITY): Payer: BC Managed Care – PPO

## 2021-07-07 ENCOUNTER — Other Ambulatory Visit: Payer: Self-pay

## 2021-07-07 ENCOUNTER — Ambulatory Visit
Admission: EM | Admit: 2021-07-07 | Discharge: 2021-07-07 | Disposition: A | Discharge status: Other Acute Inpatient Hospital | Payer: BC Managed Care – PPO

## 2021-07-07 DIAGNOSIS — R109 Unspecified abdominal pain: Secondary | ICD-10-CM | POA: Diagnosis present

## 2021-07-07 DIAGNOSIS — R1031 Right lower quadrant pain: Secondary | ICD-10-CM | POA: Diagnosis not present

## 2021-07-07 DIAGNOSIS — K389 Disease of appendix, unspecified: Secondary | ICD-10-CM | POA: Insufficient documentation

## 2021-07-07 DIAGNOSIS — F17210 Nicotine dependence, cigarettes, uncomplicated: Secondary | ICD-10-CM | POA: Diagnosis not present

## 2021-07-07 LAB — CBC WITH DIFFERENTIAL/PLATELET
Abs Immature Granulocytes: 0.01 10*3/uL (ref 0.00–0.07)
Basophils Absolute: 0 10*3/uL (ref 0.0–0.1)
Basophils Relative: 0 %
Eosinophils Absolute: 0.3 10*3/uL (ref 0.0–0.5)
Eosinophils Relative: 5 %
HCT: 44.1 % (ref 39.0–52.0)
Hemoglobin: 15.2 g/dL (ref 13.0–17.0)
Immature Granulocytes: 0 %
Lymphocytes Relative: 37 %
Lymphs Abs: 2 10*3/uL (ref 0.7–4.0)
MCH: 31.6 pg (ref 26.0–34.0)
MCHC: 34.5 g/dL (ref 30.0–36.0)
MCV: 91.7 fL (ref 80.0–100.0)
Monocytes Absolute: 0.5 10*3/uL (ref 0.1–1.0)
Monocytes Relative: 9 %
Neutro Abs: 2.6 10*3/uL (ref 1.7–7.7)
Neutrophils Relative %: 49 %
Platelets: 295 10*3/uL (ref 150–400)
RBC: 4.81 MIL/uL (ref 4.22–5.81)
RDW: 13.2 % (ref 11.5–15.5)
WBC: 5.3 10*3/uL (ref 4.0–10.5)
nRBC: 0 % (ref 0.0–0.2)

## 2021-07-07 LAB — URINALYSIS, ROUTINE W REFLEX MICROSCOPIC
Bilirubin Urine: NEGATIVE
Glucose, UA: NEGATIVE mg/dL
Hgb urine dipstick: NEGATIVE
Ketones, ur: NEGATIVE mg/dL
Leukocytes,Ua: NEGATIVE
Nitrite: NEGATIVE
Protein, ur: NEGATIVE mg/dL
Specific Gravity, Urine: 1.03 (ref 1.005–1.030)
pH: 5 (ref 5.0–8.0)

## 2021-07-07 LAB — COMPREHENSIVE METABOLIC PANEL
ALT: 19 U/L (ref 0–44)
AST: 20 U/L (ref 15–41)
Albumin: 3.6 g/dL (ref 3.5–5.0)
Alkaline Phosphatase: 75 U/L (ref 38–126)
Anion gap: 10 (ref 5–15)
BUN: 15 mg/dL (ref 6–20)
CO2: 24 mmol/L (ref 22–32)
Calcium: 8.9 mg/dL (ref 8.9–10.3)
Chloride: 105 mmol/L (ref 98–111)
Creatinine, Ser: 1.11 mg/dL (ref 0.61–1.24)
GFR, Estimated: >60 mL/min (ref >60)
Glucose, Bld: 97 mg/dL (ref 70–99)
Potassium: 4 mmol/L (ref 3.5–5.1)
Sodium: 139 mmol/L (ref 135–145)
Total Bilirubin: 0.5 mg/dL (ref 0.3–1.2)
Total Protein: 6.9 g/dL (ref 6.5–8.1)

## 2021-07-07 LAB — LIPASE, BLOOD: Lipase: 45 U/L (ref 11–51)

## 2021-07-07 MED ORDER — IOHEXOL 350 MG/ML SOLN
80.0000 mL | Freq: Once | INTRAVENOUS | Status: AC | PRN
  Administered 2021-07-07: 80 mL via INTRAVENOUS

## 2021-07-07 MED ORDER — IBUPROFEN 600 MG PO TABS
600.0000 mg | ORAL_TABLET | Freq: Four times a day (QID) | ORAL | 0 refills | Status: DC | PRN
Start: 2021-07-07 — End: 2022-09-23

## 2021-07-07 MED ORDER — DICLOFENAC SODIUM 1 % EX GEL: 2.0000 g | Freq: Four times a day (QID) | CUTANEOUS | 0 refills | Status: DC

## 2021-07-07 NOTE — ED Provider Notes (Signed)
Emergency Medicine Provider Triage Evaluation Note  Alfred Wyatt , a 45 y.o. male  was evaluated in triage.  Pt complains of right lower quadrant pain x3 weeks.  Pain is intermittent, does not radiate.  Worsened by moving.  Has not tried any alleviating factors.  Patient was seen in urgent care earlier and sent to ED for CT scan to rule out appendicitis.  He denies any prior abdominal surgeries, no change in bowel habits, no nausea or vomiting or fevers.  Drinks roughly a pint of liquor twice weekly.  Denies any anti-inflammatory drug use.  Review of Systems  Positive: Right lower quadrant pain Negative: Dysuria, hematuria, nausea, vomiting, fever  Physical Exam  BP (!) 140/109 (BP Location: Right Arm)   Pulse 93   Temp 97.9 F (36.6 C)   Resp 18   Ht 6\' 1"  (1.854 m)   Wt 99.8 kg   SpO2 98%   BMI 29.03 kg/m  Gen:   Awake, no distress   Resp:  Normal effort  MSK:   Moves extremities without difficulty  Other:    Medical Decision Making  Medically screening exam initiated at 1:24 PM.  Appropriate orders placed.  Alfred Wyatt was informed that the remainder of the evaluation will be completed by another provider, this initial triage assessment does not replace that evaluation, and the importance of remaining in the ED until their evaluation is complete.     Sherrill Raring, PA-C 07/07/21 1436    Isla Pence, MD 07/07/21 [phone]

## 2021-07-07 NOTE — ED Provider Notes (Signed)
[name]    CSN: [id number] Arrival date & time: 07/07/21  1135      History   Chief Complaint Chief Complaint  Patient presents with   Right Sided Pain    HPI Alfred Wyatt is a 45 y.o. male.   Patient presents with right lower abdominal pain that has been present for approximately 3 weeks.  Patient describes the pain as "throbbing", is intermittent, and is rated 6/10 on pain scale.  Denies any injury to the area.  Denies any known fevers, vomiting, diarrhea but states that he has had nausea that started this morning.  Patient reports that pain has gotten significantly worse today.  Denies any constipation and is having regular bowel movements.  Denies any urinary burning or frequency.  No aggravating factors for pain.  Denies chest pain or shortness of breath.    History reviewed. No pertinent past medical history.  There are no problems to display for this patient.   Past Surgical History:  Procedure Laterality Date   ANKLE SURGERY         Home Medications    Prior to Admission medications   Medication Sig Start Date End Date Taking? Authorizing Provider  diclofenac (VOLTAREN) 50 MG EC tablet Take 1 tablet (50 mg total) by mouth 2 (two) times daily. 03/05/20   Tasia Catchings, Amy V, PA-C  tiZANidine (ZANAFLEX) 2 MG tablet Take 1 tablet (2 mg total) by mouth every 8 (eight) hours as needed for muscle spasms. 03/05/20   Ok Edwards, PA-C    Family History No family history on file.  Social History Social History   Tobacco Use   Smoking status: Every Day    Packs/day: 0.50    Types: Cigarettes   Smokeless tobacco: Never  Substance Use Topics   Alcohol use: Yes    Alcohol/week: 5.0 standard drinks    Types: 5 Shots of liquor per week    Comment: socially   Drug use: No     Allergies   Patient has no known allergies.   Review of Systems Review of Systems Per HPI  Physical Exam Triage Vital Signs ED Triage Vitals  Enc Vitals Group     BP  07/07/21 1222 130/90     Pulse Rate 07/07/21 1222 88     Resp 07/07/21 1222 18     Temp 07/07/21 1222 98.2 F (36.8 C)     Temp Source 07/07/21 1222 Oral     SpO2 07/07/21 1222 97 %     Weight 07/07/21 1223 220 lb (99.8 kg)     Height 07/07/21 1223 6\' 1"  (1.854 m)     Head Circumference --      Peak Flow --      Pain Score 07/07/21 1223 6     Pain Loc --      Pain Edu? --      Excl. in [place]? --    No data found.  Updated Vital Signs BP 130/90 (BP Location: Left Arm)   Pulse 88   Temp 98.2 F (36.8 C) (Oral)   Resp 18   Ht 6\' 1"  (1.854 m)   Wt 220 lb (99.8 kg)   SpO2 97%   BMI 29.03 kg/m   Visual Acuity Right Eye Distance:   Left Eye Distance:   Bilateral Distance:    Right Eye Near:   Left Eye Near:    Bilateral Near:     Physical Exam Constitutional:  General: He is not in acute distress.    Appearance: Normal appearance. He is not toxic-appearing or diaphoretic.  HENT:     Head: Normocephalic and atraumatic.  Eyes:     Extraocular Movements: Extraocular movements intact.     Conjunctiva/sclera: Conjunctivae normal.  Cardiovascular:     Rate and Rhythm: Normal rate and regular rhythm.     Pulses: Normal pulses.     Heart sounds: Normal heart sounds.  Pulmonary:     Effort: Pulmonary effort is normal.     Breath sounds: Normal breath sounds.  Abdominal:     General: Bowel sounds are normal. There is no distension.     Palpations: Abdomen is soft.     Tenderness: There is abdominal tenderness in the right lower quadrant.  Skin:    General: Skin is warm and dry.  Neurological:     General: No focal deficit present.     Mental Status: He is alert and oriented to person, place, and time. Mental status is at baseline.  Psychiatric:        Mood and Affect: Mood normal.        Behavior: Behavior normal.        Thought Content: Thought content normal.        Judgment: Judgment normal.     UC Treatments / Results  Labs (all labs ordered are listed,  but only abnormal results are displayed) Labs Reviewed - No data to display  EKG   Radiology No results found.  Procedures Procedures (including critical care time)  Medications Ordered in UC Medications - No data to display  Initial Impression / Assessment and Plan / UC Course  I have reviewed the triage vital signs and the nursing notes.  Pertinent labs & imaging results that were available during my care of the patient were reviewed by me and considered in my medical decision making (see chart for details).     Highly suspicious of appendicitis.  Patient will need further imaging of the abdomen to determine etiology of worsening abdominal pain.  Patient was advised to go to the hospital for further evaluation and management.  Patient was agreeable with plan.  Vital signs stable at discharge.  Agree with patient self transport to the hospital. Final Clinical Impressions(s) / UC Diagnoses   Final diagnoses:  Right lower quadrant pain     Discharge Instructions      Please go the hospital as soon as you leave urgent care for further evaluation and management.     ED Prescriptions   None    PDMP not reviewed this encounter.   Odis Luster, [place] 07/07/21 1253

## 2021-07-07 NOTE — Discharge Instructions (Addendum)
Please go the hospital as soon as you leave urgent care for further evaluation and management.

## 2021-07-07 NOTE — Discharge Instructions (Signed)

## 2021-07-07 NOTE — ED Notes (Signed)
Patient transported to CT 

## 2021-07-07 NOTE — ED Triage Notes (Signed)
Patient c/o right lower side pain x 3 weeks.  Pain comes and go.  No apparent injury.  No problem with urination and normal BMs.

## 2021-07-07 NOTE — ED Triage Notes (Signed)
Pt endorses intermittent RLQ pain for a few weeks.

## 2021-07-07 NOTE — ED Provider Notes (Signed)
Emergency Department Provider Note   I have reviewed the triage vital signs and the nursing notes.   HISTORY  Chief Complaint Abdominal Pain   HPI CAI ANFINSON is a 45 y.o. male with past medical history reviewed below presents emergency department with right lateral abdominal pain.  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  He mainly has pain with work that resolves with rest.  Pain does not radiate to other parts of his abdomen or chest.  He is not feeling diaphoretic or shortness of breath.  No fevers or chills.  No dysuria, hesitancy, urgency.  No gross hematuria.  He states that his boss sent him home today due to the pain and told him he had to have a doctor's note to return.  He states he feels well and would like to return to work.   History reviewed. No pertinent past medical history.  There are no problems to display for this patient.   Past Surgical History:  Procedure Laterality Date   ANKLE SURGERY      Allergies Patient has no known allergies.  No family history on file.  Social History Social History   Tobacco Use   Smoking status: Every Day    Packs/day: 0.50    Types: Cigarettes   Smokeless tobacco: Never  Substance Use Topics   Alcohol use: Yes    Alcohol/week: 5.0 standard drinks    Types: 5 Shots of liquor per week    Comment: socially   Drug use: No    Review of Systems  Constitutional: No fever/chills Eyes: No visual changes. ENT: No sore throat. Cardiovascular: Denies chest pain. Respiratory: Denies shortness of breath. Gastrointestinal: Positive right flank/abdominal pain.  No nausea, no vomiting.  No diarrhea.  No constipation. Genitourinary: Negative for dysuria. Musculoskeletal: Negative for back pain. Skin: Negative for rash. Neurological: Negative for headaches, focal weakness or numbness.  10-point ROS otherwise negative.  ____________________________________________   PHYSICAL  EXAM:  VITAL SIGNS: ED Triage Vitals  Enc Vitals Group     BP 07/07/21 1323 (!) 140/109     Pulse Rate 07/07/21 1323 93     Resp 07/07/21 1323 18     Temp 07/07/21 1323 97.9 F (36.6 C)     Temp src --      SpO2 07/07/21 1323 98 %     Weight 07/07/21 1322 220 lb (99.8 kg)     Height 07/07/21 1322 6\' 1"  (1.854 m)   Constitutional: Alert and oriented. Well appearing and in no acute distress. Eyes: Conjunctivae are normal.  Head: Atraumatic. Nose: No congestion/rhinnorhea. Mouth/Throat: Mucous membranes are moist.   Neck: No stridor.   Cardiovascular: Normal rate, regular rhythm. Good peripheral circulation. Grossly normal heart sounds.   Respiratory: Normal respiratory effort.  No retractions. Lungs CTAB. Gastrointestinal: Soft and nontender.  Specifically no periumbilical tenderness or visible hernia. no distention.  Musculoskeletal: No lower extremity tenderness nor edema. No gross deformities of extremities. Neurologic:  Normal speech and language. No gross focal neurologic deficits are appreciated.  Skin:  Skin is warm, dry and intact. No rash noted.  ____________________________________________   LABS (all labs ordered are listed, but only abnormal results are displayed)  Labs Reviewed  CBC WITH DIFFERENTIAL/PLATELET  COMPREHENSIVE METABOLIC PANEL  LIPASE, BLOOD  URINALYSIS, ROUTINE W REFLEX MICROSCOPIC   ____________________________________________  RADIOLOGY  CT Abdomen Pelvis W Contrast  Result Date: 07/07/2021 CLINICAL DATA:  Right lower quadrant abdominal pain. Appendicitis suspected.  EXAM: CT ABDOMEN AND PELVIS WITH CONTRAST TECHNIQUE: Multidetector CT imaging of the abdomen and pelvis was performed using the standard protocol following bolus administration of intravenous contrast. CONTRAST:  61mL OMNIPAQUE IOHEXOL 350 MG/ML SOLN COMPARISON:  CT abdomen pelvis 01/16/2017 FINDINGS: Lower chest: No acute abnormality. Hepatobiliary: No focal liver abnormality. No  gallstones, gallbladder wall thickening, or pericholecystic fluid. No biliary dilatation. Pancreas: No focal lesion. Normal pancreatic contour. No surrounding inflammatory changes. No main pancreatic ductal dilatation. Spleen: Normal in size without focal abnormality. Adrenals/Urinary Tract: No adrenal nodule bilaterally. Bilateral kidneys enhance symmetrically. No hydronephrosis. No hydroureter. The urinary bladder is unremarkable. Stomach/Bowel: Stomach is within normal limits. No evidence of bowel wall thickening or dilatation. Fatty infiltration of the ascending colon wall suggestive of chronic inflammatory changes. Diffuse colonic diverticulosis. Appendix appears normal. Vascular/Lymphatic: No abdominal aorta or iliac aneurysm. No abdominal, pelvic, or inguinal lymphadenopathy. Reproductive: Prostate is unremarkable. Other: No intraperitoneal free fluid. No intraperitoneal free gas. No organized fluid collection. Musculoskeletal: Tiny fat containing supraumbilical ventral hernia with an abdominal defect of 5 mm. Associated fat stranding of the contained hernia (3:60, 7:95). No suspicious lytic or blastic osseous lesions. No acute displaced fracture. IMPRESSION: 1. Normal appendix. 2. Diffuse colonic diverticulosis with no definite acute diverticulitis. 3. Tiny fat containing supraumbilical ventral hernia with an abdominal defect of 5 mm. Associated fat stranding of the contained hernia. Correlate with physical exam for incarceration. Electronically Signed   By: Iven Finn M.D.   On: 07/07/2021 17:39    ____________________________________________   PROCEDURES  Procedure(s) performed:   Procedures  None  ____________________________________________   INITIAL IMPRESSION / ASSESSMENT AND PLAN / ED COURSE  Pertinent labs & imaging results that were available during my care of the patient were reviewed by me and considered in my medical decision making (see chart for details).   Patient  presents emergency department with right side abdominal pain, worse with working/moving.  Seems musculoskeletal clinically.  CT imaging ordered during the MSE process showing normal appendix.  No diverticulitis.  He does have a fat-containing supraumbilical ventral hernia.  I do not appreciate any focal swelling in this area on exam.  He has no tenderness or subjective pain in this area.  I discussed this imaging finding with him and that if he develops pain or swelling he will need to return for evaluation or discussed with his PCP regarding general surgery referral.   ____________________________________________  FINAL CLINICAL IMPRESSION(S) / ED DIAGNOSES  Final diagnoses:  Right flank pain     MEDICATIONS GIVEN DURING THIS VISIT:  Medications  iohexol (OMNIPAQUE) 350 MG/ML injection 80 mL (80 mLs Intravenous Contrast Given 07/07/21 1659)     NEW OUTPATIENT MEDICATIONS STARTED DURING THIS VISIT:  New Prescriptions   DICLOFENAC SODIUM (VOLTAREN) 1 % GEL    Apply 2 g topically 4 (four) times daily.   IBUPROFEN (ADVIL) 600 MG TABLET    Take 1 tablet (600 mg total) by mouth every 6 (six) hours as needed for moderate pain.    Note:  This document was prepared using Dragon voice recognition software and may include unintentional dictation errors.  Nanda Quinton, MD, [facility]    Loys Hoselton, Wonda Olds, MD 07/07/21 [phone]

## 2022-05-29 ENCOUNTER — Other Ambulatory Visit: Payer: Self-pay

## 2022-05-29 ENCOUNTER — Encounter (HOSPITAL_COMMUNITY): Payer: Self-pay | Admitting: Emergency Medicine

## 2022-05-29 ENCOUNTER — Emergency Department (HOSPITAL_COMMUNITY)
Admission: EM | Admit: 2022-05-29 | Discharge: 2022-05-29 | Discharge status: Against Medical Advice | Payer: BC Managed Care – PPO | Attending: Emergency Medicine | Admitting: Emergency Medicine

## 2022-05-29 DIAGNOSIS — Z5321 Procedure and treatment not carried out due to patient leaving prior to being seen by health care provider: Secondary | ICD-10-CM | POA: Insufficient documentation

## 2022-05-29 DIAGNOSIS — R200 Anesthesia of skin: Secondary | ICD-10-CM | POA: Insufficient documentation

## 2022-05-29 DIAGNOSIS — R0789 Other chest pain: Secondary | ICD-10-CM | POA: Insufficient documentation

## 2022-05-29 DIAGNOSIS — R202 Paresthesia of skin: Secondary | ICD-10-CM | POA: Insufficient documentation

## 2022-05-29 LAB — BASIC METABOLIC PANEL
Anion gap: 9 (ref 5–15)
BUN: 16 mg/dL (ref 6–20)
CO2: 28 mmol/L (ref 22–32)
Calcium: 9.1 mg/dL (ref 8.9–10.3)
Chloride: 103 mmol/L (ref 98–111)
Creatinine, Ser: 1.11 mg/dL (ref 0.61–1.24)
GFR, Estimated: >60 mL/min (ref >60)
Glucose, Bld: 85 mg/dL (ref 70–99)
Potassium: 3.4 mmol/L — ABNORMAL LOW (ref 3.5–5.1)
Sodium: 140 mmol/L (ref 135–145)

## 2022-05-29 LAB — CBC
HCT: 43.6 % (ref 39.0–52.0)
Hemoglobin: 15.1 g/dL (ref 13.0–17.0)
MCH: 32.1 pg (ref 26.0–34.0)
MCHC: 34.6 g/dL (ref 30.0–36.0)
MCV: 92.8 fL (ref 80.0–100.0)
Platelets: 233 10*3/uL (ref 150–400)
RBC: 4.7 MIL/uL (ref 4.22–5.81)
RDW: 13.8 % (ref 11.5–15.5)
WBC: 5 10*3/uL (ref 4.0–10.5)
nRBC: 0 % (ref 0.0–0.2)

## 2022-05-29 LAB — TROPONIN I (HIGH SENSITIVITY): Troponin I (High Sensitivity): 6 ng/L (ref <18)

## 2022-05-29 NOTE — ED Notes (Signed)
Called for vital check x4 with no pt response.

## 2022-05-29 NOTE — ED Triage Notes (Signed)
Patient reports having chest pain and left hand numbness earlier today. Symptoms have improved but still having a tingling sensation to left index finger. Patient had tightness to left side of chest that completely resolved.

## 2022-06-21 ENCOUNTER — Emergency Department (HOSPITAL_COMMUNITY)
Admission: EM | Admit: 2022-06-21 | Discharge: 2022-06-21 | Disposition: A | Discharge status: Home / Self Care | Payer: Self-pay | Attending: Emergency Medicine | Admitting: Emergency Medicine

## 2022-06-21 ENCOUNTER — Emergency Department (HOSPITAL_COMMUNITY): Payer: Self-pay

## 2022-06-21 DIAGNOSIS — K5792 Diverticulitis of intestine, part unspecified, without perforation or abscess without bleeding: Secondary | ICD-10-CM | POA: Insufficient documentation

## 2022-06-21 LAB — CBC WITH DIFFERENTIAL/PLATELET
Abs Immature Granulocytes: 0.01 10*3/uL (ref 0.00–0.07)
Basophils Absolute: 0 10*3/uL (ref 0.0–0.1)
Basophils Relative: 1 %
Eosinophils Absolute: 0.2 10*3/uL (ref 0.0–0.5)
Eosinophils Relative: 3 %
HCT: 47.4 % (ref 39.0–52.0)
Hemoglobin: 15.8 g/dL (ref 13.0–17.0)
Immature Granulocytes: 0 %
Lymphocytes Relative: 34 %
Lymphs Abs: 1.9 10*3/uL (ref 0.7–4.0)
MCH: 31.7 pg (ref 26.0–34.0)
MCHC: 33.3 g/dL (ref 30.0–36.0)
MCV: 95.2 fL (ref 80.0–100.0)
Monocytes Absolute: 0.4 10*3/uL (ref 0.1–1.0)
Monocytes Relative: 8 %
Neutro Abs: 3.1 10*3/uL (ref 1.7–7.7)
Neutrophils Relative %: 54 %
Platelets: 232 10*3/uL (ref 150–400)
RBC: 4.98 MIL/uL (ref 4.22–5.81)
RDW: 13.9 % (ref 11.5–15.5)
WBC: 5.7 10*3/uL (ref 4.0–10.5)
nRBC: 0 % (ref 0.0–0.2)

## 2022-06-21 LAB — COMPREHENSIVE METABOLIC PANEL
ALT: 20 U/L (ref 0–44)
AST: 20 U/L (ref 15–41)
Albumin: 4.4 g/dL (ref 3.5–5.0)
Alkaline Phosphatase: 63 U/L (ref 38–126)
Anion gap: 6 (ref 5–15)
BUN: 17 mg/dL (ref 6–20)
CO2: 28 mmol/L (ref 22–32)
Calcium: 9 mg/dL (ref 8.9–10.3)
Chloride: 105 mmol/L (ref 98–111)
Creatinine, Ser: 0.92 mg/dL (ref 0.61–1.24)
GFR, Estimated: >60 mL/min (ref >60)
Glucose, Bld: 98 mg/dL (ref 70–99)
Potassium: 3.7 mmol/L (ref 3.5–5.1)
Sodium: 139 mmol/L (ref 135–145)
Total Bilirubin: 0.5 mg/dL (ref 0.3–1.2)
Total Protein: 7.3 g/dL (ref 6.5–8.1)

## 2022-06-21 LAB — LIPASE, BLOOD: Lipase: 50 U/L (ref 11–51)

## 2022-06-21 LAB — URINALYSIS, ROUTINE W REFLEX MICROSCOPIC
Bilirubin Urine: NEGATIVE
Glucose, UA: NEGATIVE mg/dL
Hgb urine dipstick: NEGATIVE
Ketones, ur: NEGATIVE mg/dL
Leukocytes,Ua: NEGATIVE
Nitrite: NEGATIVE
Protein, ur: NEGATIVE mg/dL
Specific Gravity, Urine: 1.02 (ref 1.005–1.030)
pH: 6 (ref 5.0–8.0)

## 2022-06-21 MED ORDER — ACETAMINOPHEN 500 MG PO TABS: 500.0000 mg | ORAL_TABLET | Freq: Four times a day (QID) | ORAL | 0 refills | Status: DC | PRN

## 2022-06-21 MED ORDER — AMOXICILLIN-POT CLAVULANATE 875-125 MG PO TABS: 1.0000 | ORAL_TABLET | Freq: Two times a day (BID) | ORAL | 0 refills | Status: DC

## 2022-06-21 MED ORDER — AMOXICILLIN-POT CLAVULANATE 875-125 MG PO TABS
1.0000 | ORAL_TABLET | Freq: Once | ORAL | Status: AC
  Administered 2022-06-21: 1 via ORAL
  Filled 2022-06-21: qty 1

## 2022-06-21 MED ORDER — SODIUM CHLORIDE (PF) 0.9 % IJ SOLN
INTRAMUSCULAR | Status: AC
  Filled 2022-06-21: qty 50

## 2022-06-21 MED ORDER — ACETAMINOPHEN 325 MG PO TABS
650.0000 mg | ORAL_TABLET | Freq: Once | ORAL | Status: AC
  Administered 2022-06-21: 650 mg via ORAL
  Filled 2022-06-21: qty 2

## 2022-06-21 MED ORDER — IOHEXOL 300 MG/ML  SOLN
100.0000 mL | Freq: Once | INTRAMUSCULAR | Status: AC | PRN
  Administered 2022-06-21: 100 mL via INTRAVENOUS

## 2022-06-21 NOTE — ED Notes (Signed)
Needs IV for CT  

## 2022-06-21 NOTE — ED Triage Notes (Signed)
Pt arrived via POV, c/o abd pain, RLQ x3 days. states same area as previous hernia. States worse with palpation. Denies any n/v or diarrhea.

## 2022-06-21 NOTE — ED Provider Triage Note (Signed)
Emergency Medicine Provider Triage Evaluation Note  Alfred Wyatt , a 46 y.o. male  was evaluated in triage.  Pt complains of right lower quadrant and flank pain x3 days.  No urinary symptoms.  Denies nausea, vomiting, diarrhea.  No history of kidney stones.  Patient had a previous hernia without repair in that location.  Review of Systems  Positive: Abdominal pain Negative: dysuria  Physical Exam  BP (!) 143/109 (BP Location: Left Arm)   Pulse 78   Temp 98 F (36.7 C) (Oral)   Resp 16   SpO2 100%  Gen:   Awake, no distress   Resp:  Normal effort  MSK:   Moves extremities without difficulty  Other:    Medical Decision Making  Medically screening exam initiated at 10:59 AM.  Appropriate orders placed.  Alfred Wyatt was informed that the remainder of the evaluation will be completed by another provider, this initial triage assessment does not replace that evaluation, and the importance of remaining in the ED until their evaluation is complete.  Labs CT abdomen   Suzy Bouchard, PA-C 06/21/22 1100

## 2022-06-21 NOTE — Discharge Instructions (Addendum)
The CT scan reveals that you have a condition called diverticulitis, which is likely the cause for your pain. Please take the antibiotics prescribed. Read the instructions provided. Once better, make sure to increase fruits, vegetables and fibre in your diet.  Please return to the ER if your symptoms worsen; you have increased pain, fevers, chills, inability to keep any medications down, confusion.

## 2022-06-29 NOTE — ED Provider Notes (Signed)
[facility] DEPT Provider Note   CSN: [id number] Arrival date & time: 06/21/22  8315     History  Chief Complaint  Patient presents with   Abdominal Pain    Alfred Wyatt is a 46 y.o. male.  HPI    46 year old male comes in with chief complaint of abdominal pain.  Patient indicates that he started having right-sided abdominal pain and flank pain over the last 3 days.  Patient denies any associated nausea, vomiting, fevers, chills.  He has no diarrhea or bloody stools.  He states that he initially thought that the pain was secondary to moving heavy cartons at work, however the pain has only worsened over time despite him resting, prompting him to come to the ER.  Home Medications Prior to Admission medications   Medication Sig Start Date End Date Taking? Authorizing Provider  acetaminophen (TYLENOL) 500 MG tablet Take 1 tablet (500 mg total) by mouth every 6 (six) hours as needed. 06/21/22  Yes Varney Biles, MD  amoxicillin-clavulanate (AUGMENTIN) 875-125 MG tablet Take 1 tablet by mouth every 12 (twelve) hours. 06/21/22  Yes Varney Biles, MD  aspirin 325 MG tablet Take 325 mg by mouth every other day.    [provider]  diclofenac Sodium (VOLTAREN) 1 % GEL Apply 2 g topically 4 (four) times daily. 07/07/21   Long, Wonda Olds, MD  ibuprofen (ADVIL) 600 MG tablet Take 1 tablet (600 mg total) by mouth every 6 (six) hours as needed for moderate pain. 07/07/21   Long, Wonda Olds, MD      Allergies    Patient has no known allergies.    Review of Systems   Review of Systems  Physical Exam Updated Vital Signs BP (!) 165/74   Pulse 70   Temp 98.1 F (36.7 C)   Resp 20   SpO2 99%  Physical Exam Vitals and nursing note reviewed.  Constitutional:      Appearance: He is well-developed.  HENT:     Head: Atraumatic.  Cardiovascular:     Rate and Rhythm: Normal rate.  Pulmonary:     Effort: Pulmonary effort is normal.  Abdominal:      Tenderness: There is abdominal tenderness in the right lower quadrant, suprapubic area and left lower quadrant. There is no guarding or rebound.  Musculoskeletal:     Cervical back: Neck supple.  Skin:    General: Skin is warm.  Neurological:     Mental Status: He is alert and oriented to person, place, and time.     ED Results / Procedures / Treatments   Labs (all labs ordered are listed, but only abnormal results are displayed) Labs Reviewed  CBC WITH DIFFERENTIAL/PLATELET  COMPREHENSIVE METABOLIC PANEL  LIPASE, BLOOD  URINALYSIS, ROUTINE W REFLEX MICROSCOPIC    EKG None  Radiology No results found.  Procedures Procedures    Medications Ordered in ED Medications  iohexol (OMNIPAQUE) 300 MG/ML solution 100 mL (100 mLs Intravenous Contrast Given 06/21/22 1327)  amoxicillin-clavulanate (AUGMENTIN) 875-125 MG per tablet 1 tablet (1 tablet Oral Given 06/21/22 1944)  acetaminophen (TYLENOL) tablet 650 mg (650 mg Oral Given 06/21/22 1943)    ED Course/ Medical Decision Making/ A&P                           Medical Decision Making Risk OTC drugs. Prescription drug management.   This patient presents to the ED with chief complaint(s) of abdominal pain with  no concerning past medical history.  The differential diagnosis includes acute diverticulitis, kidney stone, cystitis, appendicitis.  Patient had basic blood work-up and CT abdomen and pelvis.  CT scan confirms diverticulitis without any complication.  He has no history of it.  Results discussed with the patient and education provided.  We will start him on antibiotics.   Final Clinical Impression(s) / ED Diagnoses Final diagnoses:  Acute diverticulitis    Rx / DC Orders ED Discharge Orders          Ordered    amoxicillin-clavulanate (AUGMENTIN) 875-125 MG tablet  Every 12 hours        06/21/22 1920    acetaminophen (TYLENOL) 500 MG tablet  Every 6 hours PRN        06/21/22 Bevelyn Buckles, MD 06/29/22 1605

## 2022-08-02 ENCOUNTER — Ambulatory Visit
Admission: EM | Admit: 2022-08-02 | Discharge: 2022-08-02 | Disposition: A | Discharge status: Home / Self Care | Payer: Self-pay | Attending: Physician Assistant | Admitting: Physician Assistant

## 2022-08-02 DIAGNOSIS — H65192 Other acute nonsuppurative otitis media, left ear: Secondary | ICD-10-CM

## 2022-08-02 MED ORDER — AMOXICILLIN 500 MG PO CAPS: 500.0000 mg | ORAL_CAPSULE | Freq: Three times a day (TID) | ORAL | 0 refills | Status: DC

## 2022-08-02 NOTE — ED Provider Notes (Signed)
[name]    CSN: [id number] Arrival date & time: 08/02/22  1157      History   Chief Complaint Chief Complaint  Patient presents with   Hypertension    HPI Alfred Wyatt is a 46 y.o. male.   Patient here today for evaluation of possible elevated blood pressure readings, mild dizziness and pain to his left posterior head. He reports that symptoms started today. He does report improvement. He did have some numbness and tingling in left hand but states that he has carpal tunnel and tingling is not abnormal. He has not had any chest pain or shortness of breath. He reports overall symptoms have improved. He denies any weakness. He has had pain in his left ear- was not sure if he might have ear infection.   The history is provided by the patient.  Hypertension Associated symptoms include headaches. Pertinent negatives include no chest pain and no shortness of breath.    History reviewed. No pertinent past medical history.  There are no problems to display for this patient.   Past Surgical History:  Procedure Laterality Date   ANKLE SURGERY         Home Medications    Prior to Admission medications   Medication Sig Start Date End Date Taking? Authorizing Provider  amoxicillin (AMOXIL) 500 MG capsule Take 1 capsule (500 mg total) by mouth 3 (three) times daily. 08/02/22  Yes Francene Finders, PA-C  acetaminophen (TYLENOL) 500 MG tablet Take 1 tablet (500 mg total) by mouth every 6 (six) hours as needed. 06/21/22   Varney Biles, MD  amoxicillin-clavulanate (AUGMENTIN) 875-125 MG tablet Take 1 tablet by mouth every 12 (twelve) hours. 06/21/22   Varney Biles, MD  aspirin 325 MG tablet Take 325 mg by mouth every other day.    [provider]  diclofenac Sodium (VOLTAREN) 1 % GEL Apply 2 g topically 4 (four) times daily. 07/07/21   Long, Wonda Olds, MD  ibuprofen (ADVIL) 600 MG tablet Take 1 tablet (600 mg total) by mouth every 6 (six) hours as  needed for moderate pain. 07/07/21   Long, Wonda Olds, MD    Family History History reviewed. No pertinent family history.  Social History Social History   Tobacco Use   Smoking status: Every Day    Packs/day: 0.50    Types: Cigarettes   Smokeless tobacco: Never  Substance Use Topics   Alcohol use: Yes    Alcohol/week: 5.0 standard drinks of alcohol    Types: 5 Shots of liquor per week    Comment: socially   Drug use: No     Allergies   Patient has no known allergies.   Review of Systems Review of Systems  Constitutional:  Negative for chills and fever.  Eyes:  Negative for discharge and redness.  Respiratory:  Negative for shortness of breath.   Cardiovascular:  Negative for chest pain.  Neurological:  Positive for dizziness ("off balance"), numbness and headaches. Negative for weakness.     Physical Exam Triage Vital Signs ED Triage Vitals  Enc Vitals Group     BP 08/02/22 1308 (!) 145/76     Pulse Rate 08/02/22 1308 75     Resp 08/02/22 1308 17     Temp 08/02/22 1308 98.1 F (36.7 C)     Temp Source 08/02/22 1308 Oral     SpO2 08/02/22 1308 98 %     Weight --      Height --  Head Circumference --      Peak Flow --      Pain Score 08/02/22 1311 5     Pain Loc --      Pain Edu? --      Excl. in [place]? --    No data found.  Updated Vital Signs BP (!) 145/76 (BP Location: Left Arm)   Pulse 75   Temp 98.1 F (36.7 C) (Oral)   Resp 17   SpO2 98%   Physical Exam Vitals and nursing note reviewed.  Constitutional:      General: He is not in acute distress.    Appearance: Normal appearance. He is not ill-appearing.  HENT:     Head: Normocephalic and atraumatic.     Right Ear: Tympanic membrane normal.     Ears:     Comments: Left TM erythematous, retracted Eyes:     Extraocular Movements: Extraocular movements intact.     Conjunctiva/sclera: Conjunctivae normal.     Pupils: Pupils are equal, round, and reactive to light.  Cardiovascular:      Rate and Rhythm: Normal rate.  Pulmonary:     Effort: Pulmonary effort is normal.  Neurological:     Mental Status: He is alert.     Comments: No facial droop, normal speech, no pronator drift, grip strength 5/5 bilaterally  Psychiatric:        Mood and Affect: Mood normal.        Behavior: Behavior normal.        Thought Content: Thought content normal.      UC Treatments / Results  Labs (all labs ordered are listed, but only abnormal results are displayed) Labs Reviewed - No data to display  EKG   Radiology No results found.  Procedures Procedures (including critical care time)  Medications Ordered in UC Medications - No data to display  Initial Impression / Assessment and Plan / UC Course  I have reviewed the triage vital signs and the nursing notes.  Pertinent labs & imaging results that were available during my care of the patient were reviewed by me and considered in my medical decision making (see chart for details).    Reassured that blood pressure appeared consistent with his typical readings in office, not concerning at this time. Discussed that we could not rule out TIA/ other neurological causes but given apparent otitis media, improvement of most symptoms, shared decision making used to try outpatient antibiotic therapy with very strict instruction to report to ED with any worsening or new symptoms. Encouraged follow up with any further concerns,   Final Clinical Impressions(s) / UC Diagnoses   Final diagnoses:  Other acute nonsuppurative otitis media of left ear, recurrence not specified   Discharge Instructions   None    ED Prescriptions     Medication Sig Dispense Auth. Provider   amoxicillin (AMOXIL) 500 MG capsule Take 1 capsule (500 mg total) by mouth 3 (three) times daily. 21 capsule Francene Finders, PA-C      PDMP not reviewed this encounter.   Francene Finders, PA-C 08/02/22 1427

## 2022-08-02 NOTE — ED Triage Notes (Signed)
Pt presents with intermittent elevated blood pressure with dizziness & headache.

## 2022-08-10 ENCOUNTER — Ambulatory Visit (HOSPITAL_COMMUNITY)
Admission: EM | Admit: 2022-08-10 | Discharge: 2022-08-10 | Disposition: A | Discharge status: Home / Self Care | Payer: Self-pay | Attending: Emergency Medicine | Admitting: Emergency Medicine

## 2022-08-10 ENCOUNTER — Encounter (HOSPITAL_COMMUNITY): Payer: Self-pay

## 2022-08-10 DIAGNOSIS — J0110 Acute frontal sinusitis, unspecified: Secondary | ICD-10-CM

## 2022-08-10 MED ORDER — DOXYCYCLINE HYCLATE 100 MG PO CAPS: 100.0000 mg | ORAL_CAPSULE | Freq: Two times a day (BID) | ORAL | 0 refills | Status: DC

## 2022-08-10 MED ORDER — FLUTICASONE PROPIONATE 50 MCG/ACT NA SUSP: 1.0000 | Freq: Two times a day (BID) | NASAL | 0 refills | Status: DC

## 2022-08-10 NOTE — ED Provider Notes (Signed)
[facility]    CSN: [id number] Arrival date & time: 08/10/22  1005      History   Chief Complaint Chief Complaint  Patient presents with   Facial Pain    HPI JERET GOYER is a 46 y.o. male.    Patient presents with nasal congestion, rhinorrhea, sinus pain and pressure and a nonproductive cough for 7 days.  Symptoms are worse than on the left side causing headaches and pain behind the left eye.  No known sick contacts.  Tolerating food and liquids.  Has attempted use of Mucinex nasal spray which has been ineffective.  Denies fever, chills, shortness of breath, wheezing.  History reviewed. No pertinent past medical history.  There are no problems to display for this patient.   Past Surgical History:  Procedure Laterality Date   ANKLE SURGERY         Home Medications    Prior to Admission medications   Medication Sig Start Date End Date Taking? Authorizing Provider  acetaminophen (TYLENOL) 500 MG tablet Take 1 tablet (500 mg total) by mouth every 6 (six) hours as needed. 06/21/22  Yes Varney Biles, MD  aspirin 325 MG tablet Take 325 mg by mouth every other day.   Yes [provider]  ibuprofen (ADVIL) 600 MG tablet Take 1 tablet (600 mg total) by mouth every 6 (six) hours as needed for moderate pain. 07/07/21  Yes Long, Wonda Olds, MD  amoxicillin (AMOXIL) 500 MG capsule Take 1 capsule (500 mg total) by mouth 3 (three) times daily. 08/02/22   Francene Finders, PA-C  amoxicillin-clavulanate (AUGMENTIN) 875-125 MG tablet Take 1 tablet by mouth every 12 (twelve) hours. 06/21/22   Varney Biles, MD  diclofenac Sodium (VOLTAREN) 1 % GEL Apply 2 g topically 4 (four) times daily. 07/07/21   Long, Wonda Olds, MD    Family History History reviewed. No pertinent family history.  Social History Social History   Tobacco Use   Smoking status: Former    Packs/day: 0.50    Types: Cigarettes   Smokeless tobacco: Never  Vaping Use   Vaping Use: Every  day  Substance Use Topics   Alcohol use: Yes    Alcohol/week: 5.0 standard drinks of alcohol    Types: 5 Shots of liquor per week    Comment: socially   Drug use: No     Allergies   Patient has no known allergies.   Review of Systems Review of Systems  Constitutional: Negative.   HENT:  Positive for congestion, rhinorrhea, sinus pressure and sinus pain. Negative for dental problem, drooling, ear discharge, ear pain, facial swelling, hearing loss, mouth sores, nosebleeds, postnasal drip, sneezing, sore throat, tinnitus, trouble swallowing and voice change.   Respiratory:  Positive for cough. Negative for apnea, choking, chest tightness, shortness of breath, wheezing and stridor.      Physical Exam Triage Vital Signs ED Triage Vitals  Enc Vitals Group     BP 08/10/22 1150 (!) 143/105     Pulse Rate 08/10/22 1150 72     Resp 08/10/22 1150 18     Temp 08/10/22 1150 97.7 F (36.5 C)     Temp Source 08/10/22 1150 Oral     SpO2 08/10/22 1150 98 %     Weight 08/10/22 1148 198 lb (89.8 kg)     Height 08/10/22 1148 '6\' 1"'$  (1.854 m)     Head Circumference --      Peak Flow --  Pain Score 08/10/22 1148 7     Pain Loc --      Pain Edu? --      Excl. in [place]? --    No data found.  Updated Vital Signs BP (!) 143/105 (BP Location: Left Arm)   Pulse 72   Temp 97.7 F (36.5 C) (Oral)   Resp 18   Ht '6\' 1"'$  (1.854 m)   Wt 198 lb (89.8 kg)   SpO2 98%   BMI 26.12 kg/m   Visual Acuity Right Eye Distance:   Left Eye Distance:   Bilateral Distance:    Right Eye Near:   Left Eye Near:    Bilateral Near:     Physical Exam Constitutional:      Appearance: Normal appearance.  HENT:     Head: Normocephalic.     Right Ear: Tympanic membrane, ear canal and external ear normal.     Left Ear: Tympanic membrane, ear canal and external ear normal.     Nose: Congestion and rhinorrhea present.     Right Turbinates: Swollen.     Left Turbinates: Swollen.     Right Sinus: No  maxillary sinus tenderness or frontal sinus tenderness.     Left Sinus: Frontal sinus tenderness present. No maxillary sinus tenderness.     Mouth/Throat:     Mouth: Mucous membranes are moist.     Pharynx: Posterior oropharyngeal erythema present.     Tonsils: No tonsillar exudate. 0 on the right. 0 on the left.  Eyes:     Extraocular Movements: Extraocular movements intact.  Cardiovascular:     Rate and Rhythm: Normal rate and regular rhythm.     Pulses: Normal pulses.     Heart sounds: Normal heart sounds.  Pulmonary:     Effort: Pulmonary effort is normal.     Breath sounds: Normal breath sounds.  Skin:    General: Skin is warm and dry.  Neurological:     Mental Status: He is alert and oriented to person, place, and time. Mental status is at baseline.  Psychiatric:        Mood and Affect: Mood normal.        Behavior: Behavior normal.      UC Treatments / Results  Labs (all labs ordered are listed, but only abnormal results are displayed) Labs Reviewed - No data to display  EKG   Radiology No results found.  Procedures Procedures (including critical care time)  Medications Ordered in UC Medications - No data to display  Initial Impression / Assessment and Plan / UC Course  I have reviewed the triage vital signs and the nursing notes.  Pertinent labs & imaging results that were available during my care of the patient were reviewed by me and considered in my medical decision making (see chart for details).  Acute nonrecurrent frontal sinusitis  Presentation and symptomology is consistent with a sinusitis and after her symptoms have been present for 7 days without signs of resolution we will provide bacterial coverage, doxycycline prescribed as recent use of amoxicillin as well as Flonase, advised discontinuation of Mucinex nasal spray as it is a decongestant and is most likely causing rebound symptoms, may use additional over-the-counter medication as needed for  general comfort with follow-up with urgent care if symptoms persist or worsen Final Clinical Impressions(s) / UC Diagnoses   Final diagnoses:  None   Discharge Instructions   None    ED Prescriptions   None    PDMP not  reviewed this encounter.   Hans Eden, NP 08/10/22 1208

## 2022-08-10 NOTE — ED Triage Notes (Signed)
Pt c/o left side facial pain, sinus pain x1week  Pt has taken OTC nasal spray for temporary relief.

## 2022-08-10 NOTE — Discharge Instructions (Signed)
Today you are being treated for a sinus infection  Begin doxycycline every morning and every evening for 10 days, ideally would be to see improvement in about 24 to 48 hours and steady progression from there  Exam your nose is very swollen, begin use of Flonase every morning and every evening to help reduce swelling and clear the sinuses  Stop use of Mucinex nasal spray, may begin to use again in 3 days and continue use in a 3-day on 3 days off pattern, overuse of this medicine will make your congestion feel worse  May take a Mucinex tablet as an alternative which will help thin out your secretions and make them easier to drain  Ensure that you are getting adequate fluid intake primarily through water which should help to thin your secretions making it easier to drain into.  You may attempt saline irrigation if you are able to tolerate, kits may be purchased at most pharmacies  You may take Tylenol 500 to 1000 mg every 6 hours and/or ibuprofen 600 to 800 mg every 6-8 hours as needed for management of discomfort  If your symptoms persist or worsen please follow-up with urgent care for reevaluation

## 2022-08-19 ENCOUNTER — Ambulatory Visit
Admission: EM | Admit: 2022-08-19 | Discharge: 2022-08-19 | Disposition: A | Discharge status: Home / Self Care | Payer: Self-pay | Attending: Physician Assistant | Admitting: Physician Assistant

## 2022-08-19 DIAGNOSIS — J069 Acute upper respiratory infection, unspecified: Secondary | ICD-10-CM | POA: Insufficient documentation

## 2022-08-19 DIAGNOSIS — Z1152 Encounter for screening for COVID-19: Secondary | ICD-10-CM | POA: Insufficient documentation

## 2022-08-19 LAB — POCT INFLUENZA A/B
Influenza A, POC: NEGATIVE
Influenza B, POC: NEGATIVE

## 2022-08-19 NOTE — ED Provider Notes (Signed)
[facility]    CSN: [id number] Arrival date & time: 08/19/22  1020      History   Chief Complaint Chief Complaint  Patient presents with   URI    HPI Alfred Wyatt is a 46 y.o. male.   Patient here today for evaluation of chills, cough, congestion, sore throat that started yesterday. He has had fever. He has not had nausea, vomiting, diarrhea. He has been taking mucinex without resolution.   The history is provided by the patient.  URI Presenting symptoms: congestion, cough, fever and sore throat   Presenting symptoms: no ear pain     History reviewed. No pertinent past medical history.  There are no problems to display for this patient.   Past Surgical History:  Procedure Laterality Date   ANKLE SURGERY         Home Medications    Prior to Admission medications   Medication Sig Start Date End Date Taking? Authorizing Provider  acetaminophen (TYLENOL) 500 MG tablet Take 1 tablet (500 mg total) by mouth every 6 (six) hours as needed. 06/21/22   Varney Biles, MD  aspirin 325 MG tablet Take 325 mg by mouth every other day.    [provider]  doxycycline (VIBRAMYCIN) 100 MG capsule Take 1 capsule (100 mg total) by mouth 2 (two) times daily. 08/10/22   White, Leitha Schuller, NP  fluticasone (FLONASE) 50 MCG/ACT nasal spray Place 1 spray into both nostrils in the morning and at bedtime. 08/10/22   White, Leitha Schuller, NP  ibuprofen (ADVIL) 600 MG tablet Take 1 tablet (600 mg total) by mouth every 6 (six) hours as needed for moderate pain. 07/07/21   Long, Wonda Olds, MD    Family History History reviewed. No pertinent family history.  Social History Social History   Tobacco Use   Smoking status: Former    Packs/day: 0.50    Types: Cigarettes   Smokeless tobacco: Never  Vaping Use   Vaping Use: Every day  Substance Use Topics   Alcohol use: Yes    Alcohol/week: 5.0 standard drinks of alcohol    Types: 5 Shots of liquor per week     Comment: socially   Drug use: No     Allergies   Patient has no known allergies.   Review of Systems Review of Systems  Constitutional:  Positive for chills and fever.  HENT:  Positive for congestion and sore throat. Negative for ear pain.   Eyes:  Negative for discharge and redness.  Respiratory:  Positive for cough. Negative for shortness of breath.   Gastrointestinal:  Negative for abdominal pain, diarrhea, nausea and vomiting.     Physical Exam Triage Vital Signs ED Triage Vitals [08/19/22 1101]  Enc Vitals Group     BP (!) 148/108     Pulse Rate 94     Resp 19     Temp (!) 101.2 F (38.4 C)     Temp Source Oral     SpO2 98 %     Weight      Height      Head Circumference      Peak Flow      Pain Score 10     Pain Loc      Pain Edu?      Excl. in [place]?    No data found.  Updated Vital Signs BP (!) 148/108 (BP Location: Right Arm)   Pulse 94   Temp (!) 101.2 F (38.4 C) (  Oral)   Resp 19   SpO2 98%      Physical Exam Vitals and nursing note reviewed.  Constitutional:      General: He is not in acute distress.    Appearance: Normal appearance. He is not ill-appearing.  HENT:     Head: Normocephalic and atraumatic.     Right Ear: Tympanic membrane normal.     Left Ear: Tympanic membrane normal.     Nose: Congestion present.     Mouth/Throat:     Mouth: Mucous membranes are moist.     Pharynx: Oropharynx is clear. No oropharyngeal exudate or posterior oropharyngeal erythema.  Eyes:     Conjunctiva/sclera: Conjunctivae normal.  Cardiovascular:     Rate and Rhythm: Normal rate and regular rhythm.     Heart sounds: Normal heart sounds. No murmur heard. Pulmonary:     Effort: Pulmonary effort is normal. No respiratory distress.     Breath sounds: Normal breath sounds. No wheezing, rhonchi or rales.  Skin:    General: Skin is warm and dry.  Neurological:     Mental Status: He is alert.  Psychiatric:        Mood and Affect: Mood normal.         Thought Content: Thought content normal.      UC Treatments / Results  Labs (all labs ordered are listed, but only abnormal results are displayed) Labs Reviewed  SARS CORONAVIRUS 2 (TAT 6-24 HRS)  POCT INFLUENZA A/B    EKG   Radiology No results found.  Procedures Procedures (including critical care time)  Medications Ordered in UC Medications - No data to display  Initial Impression / Assessment and Plan / UC Course  I have reviewed the triage vital signs and the nursing notes.  Pertinent labs & imaging results that were available during my care of the patient were reviewed by me and considered in my medical decision making (see chart for details).    Flu screening negative in office, will order covid screen. Suspect viral etiology of symptoms. Recommend increased fluids, symptomatic treatment and rest. Encourage follow up with any further concerns.   Final Clinical Impressions(s) / UC Diagnoses   Final diagnoses:  Encounter for screening for COVID-19  Acute upper respiratory infection   Discharge Instructions   None    ED Prescriptions   None    PDMP not reviewed this encounter.   Francene Finders, PA-C 08/19/22 1323

## 2022-08-19 NOTE — ED Triage Notes (Signed)
Pt presents to uc with co of chills, cough, congestion, sore throat for 1 day. Pt reports otc musinex for symptoms

## 2022-08-20 LAB — SARS CORONAVIRUS 2 (TAT 6-24 HRS): SARS Coronavirus 2: POSITIVE — AB

## 2022-09-23 ENCOUNTER — Ambulatory Visit
Admission: EM | Admit: 2022-09-23 | Discharge: 2022-09-23 | Disposition: A | Discharge status: Home / Self Care | Payer: Self-pay | Attending: Internal Medicine | Admitting: Internal Medicine

## 2022-09-23 DIAGNOSIS — M79671 Pain in right foot: Secondary | ICD-10-CM

## 2022-09-23 DIAGNOSIS — M79604 Pain in right leg: Secondary | ICD-10-CM

## 2022-09-23 MED ORDER — CYCLOBENZAPRINE HCL 5 MG PO TABS: 5.0000 mg | ORAL_TABLET | Freq: Two times a day (BID) | ORAL | 0 refills | Status: DC | PRN

## 2022-09-23 MED ORDER — IBUPROFEN 600 MG PO TABS: 600.0000 mg | ORAL_TABLET | Freq: Four times a day (QID) | ORAL | 0 refills | Status: DC | PRN

## 2022-09-23 NOTE — ED Provider Notes (Addendum)
[name]    CSN: [id number] Arrival date & time: 09/23/22  1047      History   Chief Complaint Chief Complaint  Patient presents with   leg pain     HPI Alfred Wyatt is a 46 y.o. male.   Patient presents with right leg and foot pain that started about 5 days ago.  Patient reports pain is present in the right upper leg and right lateral foot and bottom of foot.  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  Denies any previous history of chronic pain in that area.  He took a previously prescribed muscle relaxer with improvement in pain temporarily.  Denies any numbness or tingling.  He denies any recent long distance travel in a car or plane.  Denies that he takes any daily medications or has any chronic health problems.     History reviewed. No pertinent past medical history.  There are no problems to display for this patient.   Past Surgical History:  Procedure Laterality Date   ANKLE SURGERY         Home Medications    Prior to Admission medications   Medication Sig Start Date End Date Taking? Authorizing Provider  cyclobenzaprine (FLEXERIL) 5 MG tablet Take 1 tablet (5 mg total) by mouth 2 (two) times daily as needed for muscle spasms. 09/23/22  Yes Virlan Kempker, Hildred Alamin E, FNP  ibuprofen (ADVIL) 600 MG tablet Take 1 tablet (600 mg total) by mouth every 6 (six) hours as needed for mild pain. 09/23/22  Yes Zymier Rodgers, Hildred Alamin E, FNP  acetaminophen (TYLENOL) 500 MG tablet Take 1 tablet (500 mg total) by mouth every 6 (six) hours as needed. 06/21/22   Varney Biles, MD  aspirin 325 MG tablet Take 325 mg by mouth every other day.    [provider]  doxycycline (VIBRAMYCIN) 100 MG capsule Take 1 capsule (100 mg total) by mouth 2 (two) times daily. 08/10/22   White, Leitha Schuller, NP  fluticasone (FLONASE) 50 MCG/ACT nasal spray Place 1 spray  into both nostrils in the morning and at bedtime. 08/10/22   Hans Eden, NP    Family History History reviewed. No pertinent family history.  Social History Social History   Tobacco Use   Smoking status: Former    Packs/day: 0.50    Types: Cigarettes   Smokeless tobacco: Never  Vaping Use   Vaping Use: Every day  Substance Use Topics   Alcohol use: Yes    Alcohol/week: 5.0 standard drinks of alcohol    Types: 5 Shots of liquor per week    Comment: socially   Drug use: No     Allergies   Patient has no known allergies.   Review of Systems Review of Systems Per HPI  Physical Exam Triage Vital Signs ED Triage Vitals  Enc Vitals Group     BP 09/23/22 1204 (!) 136/91     Pulse Rate 09/23/22 1204 90     Resp 09/23/22 1204 16     Temp 09/23/22 1204 97.6 F (36.4 C)     Temp src --      SpO2 09/23/22 1204 98 %     Weight --      Height --      Head Circumference --      Peak Flow --  Pain Score 09/23/22 1203 8     Pain Loc --      Pain Edu? --      Excl. in [place]? --    No data found.  Updated Vital Signs BP (!) 136/91   Pulse 90   Temp 97.6 F (36.4 C)   Resp 16   SpO2 98%   Visual Acuity Right Eye Distance:   Left Eye Distance:   Bilateral Distance:    Right Eye Near:   Left Eye Near:    Bilateral Near:     Physical Exam Constitutional:      General: He is not in acute distress.    Appearance: Normal appearance. He is not toxic-appearing or diaphoretic.  HENT:     Head: Normocephalic and atraumatic.  Eyes:     Extraocular Movements: Extraocular movements intact.     Conjunctiva/sclera: Conjunctivae normal.  Pulmonary:     Effort: Pulmonary effort is normal.  Musculoskeletal:       Legs:       Feet:     Comments: Tenderness to palpation to upper medial right thigh as well as right foot at the lateral portion of the dorsal and plantar surface of foot.  There is no obvious swelling, discoloration, lacerations, abrasions in any of  the various affected areas.  Patient can wiggle toes.  Has full range of motion of lower extremity and foot.  Neurovascularly intact.  No tenderness to calf.  Neurological:     General: No focal deficit present.     Mental Status: He is alert and oriented to person, place, and time. Mental status is at baseline.  Psychiatric:        Mood and Affect: Mood normal.        Behavior: Behavior normal.        Thought Content: Thought content normal.        Judgment: Judgment normal.      UC Treatments / Results  Labs (all labs ordered are listed, but only abnormal results are displayed) Labs Reviewed - No data to display  EKG   Radiology No results found.  Procedures Procedures (including critical care time)  Medications Ordered in UC Medications - No data to display  Initial Impression / Assessment and Plan / UC Course  I have reviewed the triage vital signs and the nursing notes.  Pertinent labs & imaging results that were available during my care of the patient were reviewed by me and considered in my medical decision making (see chart for details).     Suspect musculoskeletal pain causing patient's discomfort.  Possible muscle strain of right upper thigh.  Given no obvious injury do not think imaging is necessary.  There is no concern for DVT at this time given physical exam and patient's history.  Suspect patient's pain is due to repetitive movements, pushing, pulling, lifting on a daily basis at work.  Has also had improvement of pain with muscle relaxer which is consistent with muscle strain/injury.  Patient does not have any leftover muscle relaxers at this time so will prescribe cyclobenzaprine to take as needed.  Advised patient this can cause drowsiness and do not drive or drink alcohol with taking it.  Will prescribe NSAID as well.  No obvious contraindications to NSAIDs noted in patient's history.  He denies that he takes any daily medications whatsoever so these  medications should be safe.  Patient was advised to follow-up with provided contact information for orthopedist if symptoms persist or  worsen.  Patient verbalized understanding and was agreeable with plan. Final Clinical Impressions(s) / UC Diagnoses   Final diagnoses:  Right leg pain  Right foot pain     Discharge Instructions      It appears that you have a muscle strain causing your pain.  I have prescribed you prescription ibuprofen and a muscle relaxer.  Please do not take any additional ibuprofen, Advil, Aleve while taking this prescription ibuprofen.  Please be advised that muscle relaxer can cause drowsiness so do not drive, operate heavy machinery, drink alcohol while taking the medication.  Follow-up with orthopedist at provided contact information if symptoms persist or worsen.    ED Prescriptions     Medication Sig Dispense Auth. Provider   ibuprofen (ADVIL) 600 MG tablet Take 1 tablet (600 mg total) by mouth every 6 (six) hours as needed for mild pain. 30 tablet [place], [place] E, [place]   cyclobenzaprine (FLEXERIL) 5 MG tablet Take 1 tablet (5 mg total) by mouth 2 (two) times daily as needed for muscle spasms. 20 tablet [place], Michele Rockers, [place]      PDMP not reviewed this encounter.   Teodora Medici, [place] 09/23/22 [place], [place],  09/23/22 1244

## 2022-09-23 NOTE — Discharge Instructions (Signed)
It appears that you have a muscle strain causing your pain.  I have prescribed you prescription ibuprofen and a muscle relaxer.  Please do not take any additional ibuprofen, Advil, Aleve while taking this prescription ibuprofen.  Please be advised that muscle relaxer can cause drowsiness so do not drive, operate heavy machinery, drink alcohol while taking the medication.  Follow-up with orthopedist at provided contact information if symptoms persist or worsen.

## 2022-09-23 NOTE — ED Triage Notes (Signed)
Pt presents to uc with co of right sided leg pain that is worse at night. Pt reports pain is stabbinf in nature and goes up  his leg to his thigh. Pt reports he used his so muscle relaxer and he helped some

## 2022-11-23 ENCOUNTER — Emergency Department (HOSPITAL_COMMUNITY)
Admission: EM | Admit: 2022-11-23 | Discharge: 2022-11-23 | Disposition: A | Discharge status: Home / Self Care | Payer: BC Managed Care – PPO | Attending: Emergency Medicine | Admitting: Emergency Medicine

## 2022-11-23 ENCOUNTER — Ambulatory Visit
Admission: EM | Admit: 2022-11-23 | Discharge: 2022-11-23 | Disposition: A | Discharge status: Home / Self Care | Payer: BC Managed Care – PPO

## 2022-11-23 ENCOUNTER — Other Ambulatory Visit: Payer: Self-pay

## 2022-11-23 ENCOUNTER — Encounter (HOSPITAL_COMMUNITY): Payer: Self-pay

## 2022-11-23 DIAGNOSIS — Z7982 Long term (current) use of aspirin: Secondary | ICD-10-CM | POA: Diagnosis not present

## 2022-11-23 DIAGNOSIS — B070 Plantar wart: Secondary | ICD-10-CM | POA: Diagnosis not present

## 2022-11-23 DIAGNOSIS — M79671 Pain in right foot: Secondary | ICD-10-CM

## 2022-11-23 MED ORDER — LIDOCAINE 4 % EX CREA
TOPICAL_CREAM | Freq: Once | CUTANEOUS | Status: AC
  Filled 2022-11-23: qty 5

## 2022-11-23 MED ORDER — LIDOCAINE 4 % EX CREA: 1.0000 | TOPICAL_CREAM | Freq: Three times a day (TID) | CUTANEOUS | 0 refills | Status: DC | PRN

## 2022-11-23 MED ORDER — ACETAMINOPHEN 500 MG PO TABS
1000.0000 mg | ORAL_TABLET | Freq: Once | ORAL | Status: AC
  Administered 2022-11-23: 1000 mg via ORAL
  Filled 2022-11-23: qty 2

## 2022-11-23 NOTE — Discharge Instructions (Addendum)
For pain control you may take 1000 mg of Tylenol every 8 hours as needed. Additionally, you can use the numbing cream.

## 2022-11-23 NOTE — ED Triage Notes (Signed)
Pt c/o severe right foot onset ~ 1.5 weeks ago says someone tried to freeze a "plantars wart" as the cause of the pain. It was unsuccessful. Pt points to base of right 5th toe.

## 2022-11-23 NOTE — Discharge Instructions (Signed)
It appears that you have a wart on your foot causing your pain.  Please follow-up with podiatry for further evaluation.

## 2022-11-23 NOTE — ED Provider Notes (Signed)
[name]    CSN: [id number] Arrival date & time: 11/23/22  [id number]      History   Chief Complaint Chief Complaint  Patient presents with   Foot Pain    HPI Alfred Wyatt is a 47 y.o. male.   Patient presents with right foot pain that has been present for about 1.5 weeks.  States that he went to clinic at his workplace and was told that he had a plantar wart where they attempted to freeze it.  He states that this did not work and his pain has increased since this attempt was made.  He went to the ED today for evaluation.  He was prescribed a topical lidocaine solution but reports it has not been helpful.  He states that he needs a work note for 3 days given that pain is significant when bearing weight.  He reports that emergency department would not provide him a work note.  Denies any injury to the foot.  Denies any drainage from the area.  Patient not reporting numbness or tingling.  Patient is also concerned about his blood pressure.  Patient does not report history of hypertension and does not take medication for it.  Patient not reporting headache, chest pain, shortness of breath, nausea, vomiting, dizziness, blurred vision.   Foot Pain    History reviewed. No pertinent past medical history.  There are no problems to display for this patient.   Past Surgical History:  Procedure Laterality Date   ANKLE SURGERY         Home Medications    Prior to Admission medications   Medication Sig Start Date End Date Taking? Authorizing Provider  acetaminophen (TYLENOL) 500 MG tablet Take 1 tablet (500 mg total) by mouth every 6 (six) hours as needed. 06/21/22   Varney Biles, MD  aspirin 325 MG tablet Take 325 mg by mouth every other day.    [provider]  cyclobenzaprine (FLEXERIL) 5 MG tablet Take 1 tablet (5 mg total) by mouth 2 (two) times daily as needed for muscle spasms. 09/23/22   Teodora Medici, FNP  doxycycline (VIBRAMYCIN) 100 MG  capsule Take 1 capsule (100 mg total) by mouth 2 (two) times daily. 08/10/22   White, Leitha Schuller, NP  fluticasone (FLONASE) 50 MCG/ACT nasal spray Place 1 spray into both nostrils in the morning and at bedtime. 08/10/22   White, Leitha Schuller, NP  ibuprofen (ADVIL) 600 MG tablet Take 1 tablet (600 mg total) by mouth every 6 (six) hours as needed for mild pain. 09/23/22   Teodora Medici, FNP  lidocaine (LMX) 4 % cream Apply 1 Application topically 3 (three) times daily as needed. 11/23/22   Fatima Blank, MD    Family History History reviewed. No pertinent family history.  Social History Social History   Tobacco Use   Smoking status: Former    Packs/day: 0.50    Types: Cigarettes   Smokeless tobacco: Never  Vaping Use   Vaping Use: Every day  Substance Use Topics   Alcohol use: Yes    Alcohol/week: 5.0 standard drinks of alcohol    Types: 5 Shots of liquor per week    Comment: socially   Drug use: No     Allergies   Patient has no known allergies.   Review of Systems Review of Systems Per HPI  Physical Exam Triage Vital Signs ED Triage Vitals  Enc Vitals Group     BP 11/23/22 0921 (!) 147/106  Pulse Rate 11/23/22 0920 89     Resp 11/23/22 0920 16     Temp 11/23/22 0920 98.2 F (36.8 C)     Temp Source 11/23/22 0920 Oral     SpO2 11/23/22 0920 98 %     Weight --      Height --      Head Circumference --      Peak Flow --      Pain Score 11/23/22 0920 10     Pain Loc --      Pain Edu? --      Excl. in [place]? --    No data found.  Updated Vital Signs BP (!) 147/106 (BP Location: Left Arm)   Pulse 89   Temp 98.2 F (36.8 C) (Oral)   Resp 16   SpO2 98%   Visual Acuity Right Eye Distance:   Left Eye Distance:   Bilateral Distance:    Right Eye Near:   Left Eye Near:    Bilateral Near:     Physical Exam Constitutional:      General: He is not in acute distress.    Appearance: Normal appearance. He is not toxic-appearing or diaphoretic.   HENT:     Head: Normocephalic and atraumatic.  Eyes:     Extraocular Movements: Extraocular movements intact.     Conjunctiva/sclera: Conjunctivae normal.  Cardiovascular:     Rate and Rhythm: Normal rate and regular rhythm.     Pulses: Normal pulses.     Heart sounds: Normal heart sounds.  Pulmonary:     Effort: Pulmonary effort is normal. No respiratory distress.     Breath sounds: Normal breath sounds.  Musculoskeletal:       Feet:  Feet:     Comments: Plantar wart present to plantar surface of foot at lateral portion.  No drainage noted.  No swelling noted.  Patient can wiggle toes.  Neurovascular intact. Neurological:     General: No focal deficit present.     Mental Status: He is alert and oriented to person, place, and time. Mental status is at baseline.  Psychiatric:        Mood and Affect: Mood normal.        Behavior: Behavior normal.        Thought Content: Thought content normal.        Judgment: Judgment normal.      UC Treatments / Results  Labs (all labs ordered are listed, but only abnormal results are displayed) Labs Reviewed - No data to display  EKG   Radiology No results found.  Procedures Procedures (including critical care time)  Medications Ordered in UC Medications - No data to display  Initial Impression / Assessment and Plan / UC Course  I have reviewed the triage vital signs and the nursing notes.  Pertinent labs & imaging results that were available during my care of the patient were reviewed by me and considered in my medical decision making (see chart for details).     Patient has plantar wart to right foot.  Patient was reporting significant pain with bearing weight and states that he can't work like this.  He is requesting a work note today.  Will provide patient work note.  Advised follow-up with provided contact information for podiatry.  Discussed supportive care.  Patient concerned about elevated blood pressure.  Patient is  asymptomatic regarding blood pressure and no signs of endorgan damage so do not think that emergent evaluation is necessary.  PCP appointment made for patient by clinical staff prior to discharge.  Encouraged patient to monitor blood pressure at home and follow-up with PCP.  Patient verbalized understanding and was agreeable with plan. Final Clinical Impressions(s) / UC Diagnoses   Final diagnoses:  Right foot pain     Discharge Instructions      It appears that you have a wart on your foot causing your pain.  Please follow-up with podiatry for further evaluation.    ED Prescriptions   None    PDMP not reviewed this encounter.   Teodora Medici, [place] 11/23/22 1016

## 2022-11-23 NOTE — ED Triage Notes (Signed)
Pt reports with right foot pain x 2 weeks. Pt states that he has a plantar wart and can barely put pressure on it.

## 2022-11-23 NOTE — ED Provider Notes (Addendum)
[facility] Provider Note  CSN: [id number] Arrival date & time: 11/23/22 0450  Chief Complaint(s) Foot Pain  HPI Alfred Wyatt is a 47 y.o. male     Foot Pain This is a new problem. The current episode started more than 1 week ago. The problem occurs constantly. The problem has been gradually worsening. Pertinent negatives include no chest pain, no abdominal pain, no headaches and no shortness of breath. The symptoms are aggravated by walking. Nothing relieves the symptoms.   Reports having a plantar wart and trying to freeze it off.  Past Medical History History reviewed. No pertinent past medical history. There are no problems to display for this patient.  Home Medication(s) Prior to Admission medications   Medication Sig Start Date End Date Taking? Authorizing Provider  acetaminophen (TYLENOL) 500 MG tablet Take 1 tablet (500 mg total) by mouth every 6 (six) hours as needed. 06/21/22   Varney Biles, MD  aspirin 325 MG tablet Take 325 mg by mouth every other day.    [provider]  cyclobenzaprine (FLEXERIL) 5 MG tablet Take 1 tablet (5 mg total) by mouth 2 (two) times daily as needed for muscle spasms. 09/23/22   Teodora Medici, FNP  doxycycline (VIBRAMYCIN) 100 MG capsule Take 1 capsule (100 mg total) by mouth 2 (two) times daily. 08/10/22   White, Leitha Schuller, NP  fluticasone (FLONASE) 50 MCG/ACT nasal spray Place 1 spray into both nostrils in the morning and at bedtime. 08/10/22   White, Leitha Schuller, NP  ibuprofen (ADVIL) 600 MG tablet Take 1 tablet (600 mg total) by mouth every 6 (six) hours as needed for mild pain. 09/23/22   Teodora Medici, FNP  lidocaine (LMX) 4 % cream Apply 1 Application topically 3 (three) times daily as needed. 11/23/22   Fatima Blank, MD                                                                                                                                     Allergies Patient has no known allergies.  Review of Systems Review of Systems  Respiratory:  Negative for shortness of breath.   Cardiovascular:  Negative for chest pain.  Gastrointestinal:  Negative for abdominal pain.  Neurological:  Negative for headaches.   As noted in HPI  Physical Exam Vital Signs  I have reviewed the triage vital signs BP (!) 156/94 (BP Location: Left Arm)   Pulse (!) 102   Temp 97.8 F (36.6 C) (Oral)   Resp 18   Ht '6\' 1"'$  (1.854 m)   Wt 89.8 kg   SpO2 99%   BMI 26.12 kg/m   Physical Exam Vitals reviewed.  Constitutional:      General: He is not in acute distress.    Appearance: He is well-developed. He is not diaphoretic.  HENT:     Head: Normocephalic and atraumatic.  Right Ear: External ear normal.     Left Ear: External ear normal.     Nose: Nose normal.     Mouth/Throat:     Mouth: Mucous membranes are moist.  Eyes:     General: No scleral icterus.    Conjunctiva/sclera: Conjunctivae normal.  Neck:     Trachea: Phonation normal.  Cardiovascular:     Rate and Rhythm: Normal rate and regular rhythm.  Pulmonary:     Effort: Pulmonary effort is normal. No respiratory distress.     Breath sounds: No stridor.  Abdominal:     General: There is no distension.  Musculoskeletal:        General: Normal range of motion.     Cervical back: Normal range of motion.       Feet:  Feet:     Right foot:     Skin integrity: Callus present.  Neurological:     Mental Status: He is alert and oriented to person, place, and time.  Psychiatric:        Behavior: Behavior normal.     ED Results and Treatments Labs (all labs ordered are listed, but only abnormal results are displayed) Labs Reviewed - No data to display                                                                                                                       EKG  EKG Interpretation  Date/Time:    Ventricular Rate:    PR Interval:    QRS Duration:   QT  Interval:    QTC Calculation:   R Axis:     Text Interpretation:         Radiology No results found.  Medications Ordered in ED Medications  acetaminophen (TYLENOL) tablet 1,000 mg (1,000 mg Oral Given 11/23/22 0525)  lidocaine (LMX) 4 % cream ( Topical Given 11/23/22 0526)                                                                                                                                     Procedures Procedures  (including critical care time)  Medical Decision Making / ED Course  Click here for ABCD2, HEART and other calculators  Medical Decision Making Risk OTC drugs.    Pain related to plantar wart. No superimposed infection. PO tylenol and topical Lidocaine provided. Podiatry follow up recommended.  Patient is requesting a work  note, stating his work [employer] requires 3 days off. Patient was informed that based in his condition he can go back to work today and discuss accommodations with his supervisor. He stated that UC would provide a work note and asked to know where the nearest UC was located. Patient walked out w/o significant difficulty.      Final Clinical Impression(s) / ED Diagnoses Final diagnoses:  Foot pain, right  Plantar wart of right foot   The patient appears reasonably screened and/or stabilized for discharge and I doubt any other medical condition or other [facility] requiring further screening, evaluation, or treatment in the ED at this time. I have discussed the findings, Dx and Tx plan with the patient/family who expressed understanding and agree(s) with the plan. Discharge instructions discussed at length. The patient/family was given strict return precautions who verbalized understanding of the instructions. No further questions at time of discharge.  Disposition: Discharge  Condition: Good  ED Discharge Orders          Ordered    lidocaine (LMX) 4 % cream  3 times daily PRN,   Status:  Discontinued        11/23/22 0508    lidocaine  (LMX) 4 % cream  3 times daily PRN        11/23/22 0516              Follow Up: Manuel Garcia Address: [address], [place], [place] [address] Phone: [phone] Appointments: [email or web address] Call  to schedule an appointment for close follow up            This chart was dictated using voice recognition software.  Despite best efforts to proofread,  errors can occur which can change the documentation meaning.    Fatima Blank, MD 11/23/22 [id number]    Fatima Blank, MD 11/23/22 [id number]

## 2022-11-24 ENCOUNTER — Encounter: Payer: Self-pay | Admitting: Podiatry

## 2022-11-24 ENCOUNTER — Ambulatory Visit: Payer: BC Managed Care – PPO | Admitting: Podiatry

## 2022-11-24 DIAGNOSIS — D2371 Other benign neoplasm of skin of right lower limb, including hip: Secondary | ICD-10-CM | POA: Diagnosis not present

## 2022-11-24 DIAGNOSIS — D2372 Other benign neoplasm of skin of left lower limb, including hip: Secondary | ICD-10-CM

## 2022-11-24 DIAGNOSIS — M7751 Other enthesopathy of right foot: Secondary | ICD-10-CM

## 2022-11-24 MED ORDER — DEXAMETHASONE SODIUM PHOSPHATE 120 MG/30ML IJ SOLN
2.0000 mg | Freq: Once | INTRAMUSCULAR | Status: AC
  Administered 2022-11-24: 2 mg via INTRA_ARTICULAR

## 2022-11-24 NOTE — Progress Notes (Signed)
  Subjective:  Patient ID: Alfred Wyatt, male    DOB: 1976/08/05,  MRN: [id number] HPI Chief Complaint  Patient presents with   Foot Pain    Sub 5th MPJ bilateral (R>L) - callused areas, right is so tender with increased standing and walking, tried trimming, freezing-no help   New Patient (Initial Visit)    47 y.o. male presents with the above complaint.   ROS: Denies fever chills nausea vomit muscle aches pains calf pain back pain chest pain shortness of breath.  No past medical history on file. Past Surgical History:  Procedure Laterality Date   ANKLE SURGERY      Current Outpatient Medications:    acetaminophen (TYLENOL) 500 MG tablet, Take 1 tablet (500 mg total) by mouth every 6 (six) hours as needed., Disp: 30 tablet, Rfl: 0   aspirin 325 MG tablet, Take 325 mg by mouth every other day., Disp: , Rfl:    cyclobenzaprine (FLEXERIL) 5 MG tablet, Take 1 tablet (5 mg total) by mouth 2 (two) times daily as needed for muscle spasms., Disp: 20 tablet, Rfl: 0   doxycycline (VIBRAMYCIN) 100 MG capsule, Take 1 capsule (100 mg total) by mouth 2 (two) times daily., Disp: 20 capsule, Rfl: 0   fluticasone (FLONASE) 50 MCG/ACT nasal spray, Place 1 spray into both nostrils in the morning and at bedtime., Disp: 11.1 mL, Rfl: 0   ibuprofen (ADVIL) 600 MG tablet, Take 1 tablet (600 mg total) by mouth every 6 (six) hours as needed for mild pain., Disp: 30 tablet, Rfl: 0   lidocaine (LMX) 4 % cream, Apply 1 Application topically 3 (three) times daily as needed., Disp: 28 g, Rfl: 0  No Known Allergies Review of Systems Objective:  There were no vitals filed for this visit.  General: Well developed, nourished, in no acute distress, alert and oriented x3   Dermatological: Skin is warm, dry and supple bilateral. Nails x 10 are well maintained; remaining integument appears unremarkable at this time. There are no open sores, no preulcerative lesions, no rash or signs of infection  present.  Vascular: Dorsalis Pedis artery and Posterior Tibial artery pedal pulses are 2/4 bilateral with immedate capillary fill time. Pedal hair growth present. No varicosities and no lower extremity edema present bilateral.   Neruologic: Grossly intact via light touch bilateral. Vibratory intact via tuning fork bilateral. Protective threshold with Semmes Wienstein monofilament intact to all pedal sites bilateral. Patellar and Achilles deep tendon reflexes 2+ bilateral. No Babinski or clonus noted bilateral.   Musculoskeletal: No gross boney pedal deformities bilateral. No pain, crepitus, or limitation noted with foot and ankle range of motion bilateral. Muscular strength 5/5 in all groups tested bilateral.  Palpable bursitis plantar aspect of the fifth metatarsal heads bilaterally.  Mild reactive hyperkeratotic tissue.  Gait: Unassisted, Nonantalgic.    Radiographs:  None taken  Assessment & Plan:   Assessment: Subfifth metatarsal capsulitis bursitis bilateral  Plan: Injected the area today 2 mg of dexamethasone and local anesthetic.  Will schedule him for orthotics to be made.  He will be casted to neutral position with a cut out beneath the fifth metatarsal head bilateral.     Alette Kataoka T. Whitmer, [place]

## 2022-12-06 ENCOUNTER — Encounter: Payer: Self-pay | Admitting: Podiatry

## 2022-12-06 ENCOUNTER — Ambulatory Visit: Payer: BC Managed Care – PPO | Admitting: Podiatry

## 2022-12-06 DIAGNOSIS — M7751 Other enthesopathy of right foot: Secondary | ICD-10-CM

## 2022-12-06 DIAGNOSIS — D2371 Other benign neoplasm of skin of right lower limb, including hip: Secondary | ICD-10-CM | POA: Diagnosis not present

## 2022-12-06 NOTE — Progress Notes (Signed)
He presents today for follow-up of his fifth metatarsophalangeal joint of his right foot.  States that the pain is so severe I cannot even work he can even walk the pain shoots up my leg from my little toe area.  States that the injection we gave him last time lasted for about 4 days and then subsided.  Objective: Vital signs are stable oriented x 3.  Pulses are palpable.  Once again discussed appropriate shoe gear with him he has a porokeratotic lesion beneath the fifth metatarsal head of the right foot and is obvious that his shoe gear is cutting into this bottom edge of the foot most likely making this porokeratotic lesion develop.  He no longer has fluctuance beneath the fifth metatarsal head that the poor keratomas are present.  I debrided those today with no iatrogenic lesions.  Assessment: Painful plantarflexed fifth metatarsal most likely associated with shoe gear porokeratotic lesions benign skin lesions.  Plan: Debrided benign skin lesions he was casted for orthotics today and noted to be out of work until his orthotics come in.  I did place padding

## 2022-12-07 ENCOUNTER — Ambulatory Visit (INDEPENDENT_AMBULATORY_CARE_PROVIDER_SITE_OTHER): Payer: BC Managed Care – PPO

## 2022-12-07 DIAGNOSIS — M7752 Other enthesopathy of left foot: Secondary | ICD-10-CM | POA: Diagnosis not present

## 2022-12-07 DIAGNOSIS — D2371 Other benign neoplasm of skin of right lower limb, including hip: Secondary | ICD-10-CM

## 2022-12-07 DIAGNOSIS — M7751 Other enthesopathy of right foot: Secondary | ICD-10-CM | POA: Diagnosis not present

## 2022-12-07 NOTE — Progress Notes (Signed)
Patient presents today to be casted for custom molded orthotics. HYATT is the treating physician.  Impression foam cast was taken. ABN signed.  Patient info-  Shoe size: 10  Shoe style: ATHLETIC  Height: 6FT 1IN  Weight: 198 LBS  Insurance: [employer]   Patient will be notified once orthotics arrive in office and reappoint for fitting at that time.

## 2022-12-22 ENCOUNTER — Encounter: Payer: BC Managed Care – PPO | Admitting: Orthopedic Surgery

## 2022-12-22 ENCOUNTER — Encounter: Payer: Self-pay | Admitting: Orthopedic Surgery

## 2022-12-26 NOTE — Progress Notes (Signed)
This encounter was created in error - please disregard.

## 2023-01-02 ENCOUNTER — Other Ambulatory Visit: Payer: BC Managed Care – PPO

## 2023-01-03 ENCOUNTER — Encounter: Payer: Self-pay | Admitting: Podiatry

## 2023-01-04 ENCOUNTER — Ambulatory Visit (INDEPENDENT_AMBULATORY_CARE_PROVIDER_SITE_OTHER): Payer: BC Managed Care – PPO | Admitting: Podiatry

## 2023-01-04 DIAGNOSIS — D2371 Other benign neoplasm of skin of right lower limb, including hip: Secondary | ICD-10-CM

## 2023-01-04 DIAGNOSIS — D2372 Other benign neoplasm of skin of left lower limb, including hip: Secondary | ICD-10-CM

## 2023-01-04 DIAGNOSIS — M7751 Other enthesopathy of right foot: Secondary | ICD-10-CM

## 2023-01-04 NOTE — Progress Notes (Signed)
Patient presents today to pick up custom molded foot orthotics recommended by Dr. HYATT.   Orthotics were dispensed and fit was satisfactory. Reviewed instructions for break-in and wear. Written instructions given to patient.  Patient will follow up as needed.     

## 2023-01-05 ENCOUNTER — Encounter: Payer: Self-pay | Admitting: Podiatry

## 2023-01-05 ENCOUNTER — Ambulatory Visit: Payer: BC Managed Care – PPO | Admitting: Podiatry

## 2023-01-05 DIAGNOSIS — D2371 Other benign neoplasm of skin of right lower limb, including hip: Secondary | ICD-10-CM

## 2023-01-05 NOTE — Progress Notes (Signed)
He presents today with a painful callus beneath the fifth metatarsal of the right foot.  He got his orthotics yesterday and is looking forward to using those.  He is ready to go back to work and hope this works out well for him.  Objective: Vital signs are stable he is alert and oriented x 3.  Pulses are palpable.  There is no erythema edema cellulitis drainage or odor.  Benign skin lesion subfifth metatarsal head of the right foot once debrided today and enucleated demonstrates much decrease in tenderness.  Assessment benign skin lesions porokeratotic lesion subfifth right.  Plan: Pick up orthotics he is going to utilize those I debrided the benign skin lesion.  We did discuss the possible need for surgical intervention we will follow-up with him as needed

## 2023-02-15 ENCOUNTER — Ambulatory Visit: Payer: BC Managed Care – PPO | Admitting: Podiatry

## 2023-02-15 ENCOUNTER — Ambulatory Visit (INDEPENDENT_AMBULATORY_CARE_PROVIDER_SITE_OTHER): Payer: BC Managed Care – PPO

## 2023-02-15 ENCOUNTER — Encounter: Payer: Self-pay | Admitting: Podiatry

## 2023-02-15 DIAGNOSIS — D2371 Other benign neoplasm of skin of right lower limb, including hip: Secondary | ICD-10-CM

## 2023-02-15 DIAGNOSIS — M79671 Pain in right foot: Secondary | ICD-10-CM

## 2023-02-15 DIAGNOSIS — M778 Other enthesopathies, not elsewhere classified: Secondary | ICD-10-CM

## 2023-02-15 MED ORDER — METHYLPREDNISOLONE 4 MG PO TBPK: ORAL_TABLET | ORAL | 0 refills | Status: DC

## 2023-02-15 MED ORDER — COLCHICINE 0.6 MG PO TABS: 0.6000 mg | ORAL_TABLET | Freq: Every day | ORAL | 0 refills | Status: DC

## 2023-02-15 NOTE — Progress Notes (Signed)
  Subjective:  Patient ID: Alfred Wyatt, male    DOB: July 31, 1976,   MRN: [id number]  Chief Complaint  Patient presents with   Foot Pain    Rm 21 Right foot pain located at the lateral side of foot. Sharp pains that comes and goes randomly. Pt states he has not had any relief since seeing other providers. Orthotics aren't helping at all.     47 y.o. male presents for concern of right foot pain on the outside of his foot. Relates sharp pains started the other day that come and go randomly. Relates the lesion on the bottom does improve with debridement but the orthotics are not helping Has seen Dr. Al Corpus in the past for this problem. Does relates a family history of gout. Denis any changes in diet.  . Denies any other pedal complaints. Denies n/v/f/c.   History reviewed. No pertinent past medical history.  Objective:  Physical Exam: Vascular: DP/PT pulses 2/4 bilateral. CFT <3 seconds. Normal hair growth on digits. No edema.  Skin. No lacerations or abrasions bilateral feet. Hyperkeraotic cored lesion noted to plantar right fitth metatarsal with disruption of skin lines.  Musculoskeletal: MMT 5/5 bilateral lower extremities in DF, PF, Inversion and Eversion. Deceased ROM in DF of ankle joint. Very tender to platnar and dorsal fifth mpj with pain with ROM of the joint.  Neurological: Sensation intact to light touch.   Assessment:   1. Capsulitis of right foot   2. 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 -Xrays reviewed. There is soft tissue edema noted around fifth metatarsal on right with erosion noted to lateral aspect of fifth metatarsal head concerned for possible gout.  -Discussed treatement options for gouty arthritis and gout education provided as well as benign skin lesions causing pain -Patient deferred injection -Discussed diet and modifications.  -Rx Colchicine 0.6mg  -Medrol dose pack provided.   --Discussed benign skin lesions with patient and treatment options.  -Hyperkeratotic tissue was debrided with chisel without incident.  -Applied salycylic acid treatment to area with dressing. Advised to remove bandaging tomorrow.  -Encouraged daily moisturizing -Discussed use of pumice stone -Ordered arthritic lab panel; will call patient with results if abnormal -Advised patient to call if symptoms are not improved within 1 week -Patient to return in 3 weeks for re-check/further discussion for long term management of gout or sooner if condition worsens.   [name], DPM

## 2023-02-16 LAB — ARTHRITIS PANEL
Anti Nuclear Antibody (ANA): NEGATIVE
Rheumatoid fact SerPl-aCnc: <10 IU/mL (ref <14.0)
Sed Rate: 3 mm/hr (ref 0–15)
Uric Acid: 5.2 mg/dL (ref 3.8–8.4)

## 2023-02-21 ENCOUNTER — Telehealth: Payer: Self-pay | Admitting: Podiatry

## 2023-02-21 ENCOUNTER — Telehealth: Payer: Self-pay | Admitting: *Deleted

## 2023-02-21 NOTE — Telephone Encounter (Signed)
Called patient giving lab results, verbalized understanding, requesting a out of work note until f/u appointment in June. Please advise.

## 2023-02-21 NOTE — Telephone Encounter (Signed)
Patient is calling for his lab results from 02/15/23,please advise

## 2023-02-21 NOTE — Telephone Encounter (Signed)
Pt called to get his test results. Please advise

## 2023-02-22 ENCOUNTER — Encounter: Payer: Self-pay | Admitting: Podiatry

## 2023-02-22 NOTE — Telephone Encounter (Signed)
Thank you :)

## 2023-02-22 NOTE — Telephone Encounter (Signed)
Pt called again to inquire about the doctor's note to be out of work until his next appt on 03/08/2023. He stated that he is still in a lot of pain & can barely apply pressure to his foot. Please advise

## 2023-02-22 NOTE — Telephone Encounter (Signed)
WE can get him a not until then. Thanks

## 2023-02-23 ENCOUNTER — Other Ambulatory Visit: Payer: Self-pay | Admitting: Podiatry

## 2023-02-23 DIAGNOSIS — M778 Other enthesopathies, not elsewhere classified: Secondary | ICD-10-CM

## 2023-02-23 DIAGNOSIS — D2371 Other benign neoplasm of skin of right lower limb, including hip: Secondary | ICD-10-CM

## 2023-02-23 DIAGNOSIS — M79671 Pain in right foot: Secondary | ICD-10-CM

## 2023-02-24 ENCOUNTER — Telehealth: Payer: Self-pay | Admitting: Podiatry

## 2023-02-24 NOTE — Telephone Encounter (Signed)
I called to speak with pt. About his Disability paperwork and he indicated that he would like to speak with you about possible surgery. Alfred Wyatt stated " I don't wanna keep coming back and forth to the Doctor, I just wanna get this fixed".

## 2023-02-24 NOTE — Telephone Encounter (Signed)
We can talk about surgery at the next appointment we did not discuss in detail at that last visit. He has to fill out paperwork for surgery and cannot just go ahead with surgery without discussion.

## 2023-03-08 ENCOUNTER — Encounter: Payer: Self-pay | Admitting: Podiatry

## 2023-03-08 ENCOUNTER — Telehealth: Payer: Self-pay | Admitting: *Deleted

## 2023-03-08 ENCOUNTER — Ambulatory Visit (INDEPENDENT_AMBULATORY_CARE_PROVIDER_SITE_OTHER): Payer: BC Managed Care – PPO | Admitting: Podiatry

## 2023-03-08 DIAGNOSIS — M778 Other enthesopathies, not elsewhere classified: Secondary | ICD-10-CM

## 2023-03-08 DIAGNOSIS — D2371 Other benign neoplasm of skin of right lower limb, including hip: Secondary | ICD-10-CM

## 2023-03-08 NOTE — Telephone Encounter (Signed)
"  I just missed your call."

## 2023-03-08 NOTE — Progress Notes (Addendum)
  Subjective:  Patient ID: Alfred Wyatt, male    DOB: 07-28-1976,   MRN: [id number]  Chief Complaint  Patient presents with   Foot Pain    Right foot pain still not improving , patient states he would like surgery if possible     47 y.o. male presents for follow-up of right foot pain on the outside of his foot. Relates the pain has continued and did not improve with colchicine or medrol dose pack relates it is still coming from the callused area and patient would like to talk about surgery. Does relates a family history of gout. Denis any changes in diet.  . Denies any other pedal complaints. Denies n/v/f/c.   History reviewed. No pertinent past medical history.  Objective:  Physical Exam: Vascular: DP/PT pulses 2/4 bilateral. CFT <3 seconds. Normal hair growth on digits. No edema.  Skin. No lacerations or abrasions bilateral feet. Hyperkeraotic cored lesion noted to plantar right fitth metatarsal with disruption of skin lines.  Musculoskeletal: MMT 5/5 bilateral lower extremities in DF, PF, Inversion and Eversion. Deceased ROM in DF of ankle joint. Very tender to platnar and dorsal fifth mpj with pain with ROM of the joint. No erythema or edema noted.  Neurological: Sensation intact to light touch.   Assessment:   1. Capsulitis of right foot   2. 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 -Xrays reviewed. There is soft tissue edema noted around fifth metatarsal on right with erosion noted to lateral aspect of fifth metatarsal head concerned for possible gout.  -Discussed treatement options for capsulitis and lesion of skin.  provided as well as benign skin lesions causing pain -Reviewed urica acid and within normal limits.  -Discussed diet and modifications.  --Discussed benign skin lesions with patient and treatment options.  -Hyperkeratotic tissue was debrided with chisel without incident.  -Uric acid  and arthritis panel within normal limits.  -Applied salycylic acid treatment to area with dressing. Advised to remove bandaging tomorrow.  -Encouraged daily moisturizing -Discussed use of pumice stone -Discussed at this point trying surgery to relief pain. Patient has exhausted conservative treatments. Discussed fifth metatarsal head resection. Discussed risks with smoking and patient will quit.  -Informed surgical risk consent was reviewed and read aloud to the patient.  I reviewed the films.  I have discussed my findings with the patient in great detail.  I have discussed all risks including but not limited to infection, stiffness, scarring, limp, disability, deformity, damage to blood vessels and nerves, numbness, poor healing, need for braces, arthritis, chronic pain, amputation, death.  All benefits and realistic expectations discussed in great detail.  I have made no promises as to the outcome.  I have provided realistic expectations.  I have offered the patient a 2nd opinion, which they have declined and assured me they preferred to proceed despite the risks. Will plan for surgery 7/16. Patient does smoke and will refrain for next four weeks.    [name], DPM

## 2023-03-13 ENCOUNTER — Telehealth: Payer: Self-pay | Admitting: Urology

## 2023-03-13 NOTE — Telephone Encounter (Signed)
DOS - 04/11/23  METATARSAL HEAD RESECTION 5TH RIGHT --- 14782  [employer] EFFECTIVE DATE - 05/27/22  DEDUCTIBLE - $1,000.00 W/ $344.46 REMAINING OOP - $9,100.00 W/ $9,562.13 REMAINING COINSURANCE - 0%   RECEIVED FAX FROM [employer] [place] STATING THAT CPT CODE 08657 NO PRIOR AUTH IS REQUIRED.

## 2023-03-15 ENCOUNTER — Ambulatory Visit
Admission: EM | Admit: 2023-03-15 | Discharge: 2023-03-15 | Disposition: A | Discharge status: Home / Self Care | Payer: BC Managed Care – PPO | Attending: Internal Medicine | Admitting: Internal Medicine

## 2023-03-15 DIAGNOSIS — H65192 Other acute nonsuppurative otitis media, left ear: Secondary | ICD-10-CM

## 2023-03-15 DIAGNOSIS — J069 Acute upper respiratory infection, unspecified: Secondary | ICD-10-CM | POA: Diagnosis not present

## 2023-03-15 DIAGNOSIS — R0789 Other chest pain: Secondary | ICD-10-CM

## 2023-03-15 MED ORDER — AMOXICILLIN 875 MG PO TABS: 875.0000 mg | ORAL_TABLET | Freq: Two times a day (BID) | ORAL | 0 refills | Status: AC

## 2023-03-15 MED ORDER — METHOCARBAMOL 500 MG PO TABS: 500.0000 mg | ORAL_TABLET | Freq: Two times a day (BID) | ORAL | 0 refills | Status: DC | PRN

## 2023-03-15 NOTE — Discharge Instructions (Signed)
You have an ear infection and I have sent you an antibiotic to treat this.  Suspect cough and congestion is viral in nature and should run its course as we discussed.  Ensure adequate fluid hydration and rest.  Your chest pain is most likely due to muscular strain.  I have prescribed you a muscle relaxer to take as needed.  Follow-up with the ER if chest pain persists or worsens.

## 2023-03-15 NOTE — ED Triage Notes (Signed)
Patient with c/o chest tightness. States it started last night. Patient has also been coughing and congested. States he tried vicks vapor rub, muscle rub with no relief.

## 2023-03-15 NOTE — ED Provider Notes (Signed)
[name]    CSN: [id number] Arrival date & time: 03/15/23  1348      History   Chief Complaint Chief Complaint  Patient presents with   Chest Pain    HPI Alfred Wyatt is a 47 y.o. male.   Patient presents with chest discomfort, cough, nasal congestion, left ear pain, headache.  Patient reports that chest discomfort started yesterday.  He has used vapor rub and a "muscle rub" with minimal improvement.  He denies any injury to the area.  Denies shortness of breath.  Reports movement exacerbates the pain and is present across the entirety of the chest.  Denies history of asthma or COPD but patient does smoke cigarettes.  Cough and congestion started today.  Denies any fever or known sick contacts.   Chest Pain   History reviewed. No pertinent past medical history.  There are no problems to display for this patient.   Past Surgical History:  Procedure Laterality Date   ANKLE SURGERY         Home Medications    Prior to Admission medications   Medication Sig Start Date End Date Taking? Authorizing Provider  amoxicillin (AMOXIL) 875 MG tablet Take 1 tablet (875 mg total) by mouth 2 (two) times daily for 7 days. 03/15/23 03/22/23 Yes Elon Lomeli, Acie Fredrickson, FNP  methocarbamol (ROBAXIN) 500 MG tablet Take 1 tablet (500 mg total) by mouth 2 (two) times daily as needed for muscle spasms. 03/15/23  Yes Gustavus Bryant, FNP    Family History History reviewed. No pertinent family history.  Social History Social History   Tobacco Use   Smoking status: Former    Packs/day: .5    Types: Cigarettes   Smokeless tobacco: Never  Vaping Use   Vaping Use: Every day  Substance Use Topics   Alcohol use: Yes    Alcohol/week: 5.0 standard drinks of alcohol    Types: 5 Shots of liquor per week    Comment: socially   Drug use: No     Allergies   Patient has no known allergies.   Review of Systems Review of Systems Per HPI  Physical Exam Triage Vital  Signs ED Triage Vitals [03/15/23 1422]  Enc Vitals Group     BP 124/89     Pulse Rate 94     Resp 20     Temp 98 F (36.7 C)     Temp Source Oral     SpO2 97 %     Weight      Height      Head Circumference      Peak Flow      Pain Score 8     Pain Loc      Pain Edu?      Excl. in GC?    No data found.  Updated Vital Signs BP 124/89 (BP Location: Left Arm)   Pulse 94   Temp 98 F (36.7 C) (Oral)   Resp 20   SpO2 97%   Visual Acuity Right Eye Distance:   Left Eye Distance:   Bilateral Distance:    Right Eye Near:   Left Eye Near:    Bilateral Near:     Physical Exam Constitutional:      General: He is not in acute distress.    Appearance: Normal appearance. He is not toxic-appearing or diaphoretic.  HENT:     Head: Normocephalic and atraumatic.     Right Ear: Tympanic membrane and ear canal normal.  Left Ear: Ear canal normal. Tympanic membrane is erythematous. Tympanic membrane is not perforated or bulging.     Nose: Congestion present.     Mouth/Throat:     Mouth: Mucous membranes are moist.     Pharynx: No posterior oropharyngeal erythema.  Eyes:     Extraocular Movements: Extraocular movements intact.     Conjunctiva/sclera: Conjunctivae normal.     Pupils: Pupils are equal, round, and reactive to light.  Cardiovascular:     Rate and Rhythm: Normal rate and regular rhythm.     Pulses: Normal pulses.     Heart sounds: Normal heart sounds.  Pulmonary:     Effort: Pulmonary effort is normal. No respiratory distress.     Breath sounds: No stridor. No wheezing, rhonchi or rales.  Chest:     Comments: Patient has tenderness to palpation across entirety of upper chest.  There is no obvious swelling or crepitus noted. Neurological:     General: No focal deficit present.     Mental Status: He is alert and oriented to person, place, and time. Mental status is at baseline.  Psychiatric:        Mood and Affect: Mood normal.        Behavior: Behavior  normal.        Thought Content: Thought content normal.        Judgment: Judgment normal.      UC Treatments / Results  Labs (all labs ordered are listed, but only abnormal results are displayed) Labs Reviewed - No data to display  EKG   Radiology No results found.  Procedures Procedures (including critical care time)  Medications Ordered in UC Medications - No data to display  Initial Impression / Assessment and Plan / UC Course  I have reviewed the triage vital signs and the nursing notes.  Pertinent labs & imaging results that were available during my care of the patient were reviewed by me and considered in my medical decision making (see chart for details).     Patient presents with symptoms likely from a viral upper respiratory infection.  Do not suspect underlying cardiopulmonary process. Symptoms seem unlikely related to ACS, CHF or COPD exacerbations, pneumonia, pneumothorax. Patient is nontoxic appearing and not in need of emergent medical intervention. Recommended symptom control with over the counter medications.  Amoxicillin to treat left otitis media.  Suspect that patient's chest discomfort and pain is different etiology from viral illness as pain is reproducible with palpation.  Suspect musculoskeletal strain versus costochondritis.  EKG was completed that showed sinus rhythm with premature atrial complexes which is unchanged from previous EKG.  Therefore, low suspicion for cardiac etiology or need for emergent evaluation.  Will prescribe muscle relaxer for patient to take as needed.  Advised that it can make him drowsy and do not drive or drink alcohol with it.  Advised supportive care including ice application as well.  There are no adventitious lung sounds on exam so do not think that chest imaging is necessary.  Patient advised to go to the ER if any symptoms persist or worsen.  Patient states understanding and is agreeable.  Discharged with PCP followup.   Final Clinical Impressions(s) / UC Diagnoses   Final diagnoses:  Other non-recurrent acute nonsuppurative otitis media of left ear  Musculoskeletal chest pain  Viral upper respiratory tract infection with cough     Discharge Instructions      You have an ear infection and I have sent you an antibiotic  to treat this.  Suspect cough and congestion is viral in nature and should run its course as we discussed.  Ensure adequate fluid hydration and rest.  Your chest pain is most likely due to muscular strain.  I have prescribed you a muscle relaxer to take as needed.  Follow-up with the ER if chest pain persists or worsens.     ED Prescriptions     Medication Sig Dispense Auth. Provider   amoxicillin (AMOXIL) 875 MG tablet Take 1 tablet (875 mg total) by mouth 2 (two) times daily for 7 days. 14 tablet [place], [place], [place]   methocarbamol (ROBAXIN) 500 MG tablet Take 1 tablet (500 mg total) by mouth 2 (two) times daily as needed for muscle spasms. 20 tablet [place], Acie Fredrickson, [place]      PDMP not reviewed this encounter.   Gustavus Bryant, [place] 03/15/23 [phone]

## 2023-04-11 ENCOUNTER — Other Ambulatory Visit: Payer: Self-pay | Admitting: Podiatry

## 2023-04-11 DIAGNOSIS — M7751 Other enthesopathy of right foot: Secondary | ICD-10-CM | POA: Diagnosis not present

## 2023-04-11 MED ORDER — OXYCODONE-ACETAMINOPHEN 5-325 MG PO TABS: 1.0000 | ORAL_TABLET | ORAL | 0 refills | Status: AC | PRN

## 2023-04-11 MED ORDER — ONDANSETRON HCL 4 MG PO TABS: 4.0000 mg | ORAL_TABLET | Freq: Three times a day (TID) | ORAL | 0 refills | Status: DC | PRN

## 2023-04-17 ENCOUNTER — Ambulatory Visit (INDEPENDENT_AMBULATORY_CARE_PROVIDER_SITE_OTHER): Payer: BC Managed Care – PPO | Admitting: Podiatry

## 2023-04-17 DIAGNOSIS — Z91199 Patient's noncompliance with other medical treatment and regimen due to unspecified reason: Secondary | ICD-10-CM

## 2023-04-17 NOTE — Progress Notes (Unsigned)
No show

## 2023-05-01 ENCOUNTER — Ambulatory Visit (INDEPENDENT_AMBULATORY_CARE_PROVIDER_SITE_OTHER): Payer: BC Managed Care – PPO | Admitting: Podiatry

## 2023-05-01 ENCOUNTER — Ambulatory Visit (INDEPENDENT_AMBULATORY_CARE_PROVIDER_SITE_OTHER): Payer: BC Managed Care – PPO

## 2023-05-01 ENCOUNTER — Ambulatory Visit: Payer: BC Managed Care – PPO

## 2023-05-01 ENCOUNTER — Encounter: Payer: Self-pay | Admitting: Podiatry

## 2023-05-01 VITALS — BP 153/112 | HR 89 | Ht 73.0 in | Wt 205.0 lb

## 2023-05-01 DIAGNOSIS — Z9889 Other specified postprocedural states: Secondary | ICD-10-CM

## 2023-05-01 DIAGNOSIS — D2371 Other benign neoplasm of skin of right lower limb, including hip: Secondary | ICD-10-CM

## 2023-05-01 NOTE — Progress Notes (Signed)
  Subjective:  Patient ID: Alfred Wyatt, male    DOB: 05-26-1976,  MRN: [id number]  Chief Complaint  Patient presents with   Post-op Follow-up    Right foot 04/11/23 patient states it is painful and has a lot of discomfort between 4th 5th toes, no drainage on bandage     DOS: 04/11/23 Procedure: Right fifth metatarsal head resection  46 y.o. male returns for POV#1. Patient relates doing well in the surgery area. States he does have a very painful blister on the fourth toe making it difficult to walk. He did miss his first follow-up appointment.   Review of Systems: Negative except as noted in the HPI. Denies N/V/F/Ch.  History reviewed. No pertinent past medical history.  Current Outpatient Medications:    ondansetron (ZOFRAN) 4 MG tablet, Take 1 tablet (4 mg total) by mouth every 8 (eight) hours as needed for nausea or vomiting. (Patient not taking: Reported on 05/01/2023), Disp: 20 tablet, Rfl: 0   methocarbamol (ROBAXIN) 500 MG tablet, Take 1 tablet (500 mg total) by mouth 2 (two) times daily as needed for muscle spasms. (Patient not taking: Reported on 05/01/2023), Disp: 20 tablet, Rfl: 0  Social History   Tobacco Use  Smoking Status Former   Current packs/day: 0.50   Types: Cigarettes  Smokeless Tobacco Never    No Known Allergies Objective:   Vitals:   05/01/23 1102  BP: (!) 153/112  Pulse: 89   Body mass index is 27.05 kg/m. Constitutional Well developed. Well nourished.  Vascular Foot warm and well perfused. Capillary refill normal to all digits.   Neurologic Normal speech. Oriented to person, place, and time. Epicritic sensation to light touch grossly present bilaterally.  Dermatologic Skin healing well without signs of infection. Skin edges well coapted without signs of infection. Blister noted to distal right fourth digit upon drainage skin healing well.   Orthopedic: Tenderness to palpation noted about the surgical site.   Radiographs: Interval resection  of fifth metatarsal head.  Assessment:   1. Post-operative state   2. Benign neoplasm of skin of right foot    Plan:  Patient was evaluated and treated and all questions answered.  S/p foot surgery right -Progressing as expected post-operatively. -WB Status: WBAT in regular shoe -Sutures: removed without incident. -Medications: n/a -Foot redressed.  Blister on right four digit drained and cleaned and bandage applied. Advised to keep neosporin and bandaid on for a few days until healed.   Return in 3 weeks for recheck.   Return in about 3 weeks (around 05/22/2023) for post op.

## 2023-05-23 ENCOUNTER — Ambulatory Visit: Payer: BC Managed Care – PPO | Admitting: Podiatry

## 2023-05-23 ENCOUNTER — Encounter: Payer: Self-pay | Admitting: Podiatry

## 2023-05-23 DIAGNOSIS — Z9889 Other specified postprocedural states: Secondary | ICD-10-CM

## 2023-05-23 NOTE — Progress Notes (Signed)
  Subjective:  Patient ID: Alfred Wyatt, male    DOB: 07/27/1976,  MRN: [id number]  Chief Complaint  Patient presents with   Routine Post Op    Rm24:  POV#3. Patient relates doing well and getting around well.     DOS: 04/11/23 Procedure: Right fifth metatarsal head resection  47 y.o. male returns for POV#2. Patient relates doing well and getting around well.   Review of Systems: Negative except as noted in the HPI. Denies N/V/F/Ch.  History reviewed. No pertinent past medical history. No current outpatient medications on file.  Social History   Tobacco Use  Smoking Status Former   Current packs/day: 0.50   Types: Cigarettes  Smokeless Tobacco Never    No Known Allergies Objective:   There were no vitals filed for this visit.  There is no height or weight on file to calculate BMI. Constitutional Well developed. Well nourished.  Vascular Foot warm and well perfused. Capillary refill normal to all digits.   Neurologic Normal speech. Oriented to person, place, and time. Epicritic sensation to light touch grossly present bilaterally.  Dermatologic Skin healing well without signs of infection. Skin edges well coapted without signs of infection. Blister noted to distal right fourth digit upon drainage skin healing well.   Orthopedic: Tenderness to palpation noted about the surgical site.   Radiographs: Interval resection of fifth metatarsal head.  Assessment:   1. Post-operative state     Plan:  Patient was evaluated and treated and all questions answered.  S/p foot surgery right -Progressing as expected post-operatively. -WB Status: WBAT in regular shoe -Medications: n/a -Foot redressed.  -Will keep out of work until Sept 9th.    Return in 6 weeks for recheck   Return in about 6 weeks (around 07/04/2023) for post op.

## 2023-06-05 ENCOUNTER — Encounter: Payer: Self-pay | Admitting: Podiatry

## 2023-06-30 DIAGNOSIS — J330 Polyp of nasal cavity: Secondary | ICD-10-CM | POA: Insufficient documentation

## 2023-06-30 DIAGNOSIS — R040 Epistaxis: Secondary | ICD-10-CM | POA: Insufficient documentation

## 2023-06-30 DIAGNOSIS — J309 Allergic rhinitis, unspecified: Secondary | ICD-10-CM | POA: Insufficient documentation

## 2023-07-04 ENCOUNTER — Ambulatory Visit (INDEPENDENT_AMBULATORY_CARE_PROVIDER_SITE_OTHER): Payer: BC Managed Care – PPO | Admitting: Podiatry

## 2023-07-04 DIAGNOSIS — Z91199 Patient's noncompliance with other medical treatment and regimen due to unspecified reason: Secondary | ICD-10-CM

## 2023-07-04 NOTE — Progress Notes (Signed)
No show

## 2023-09-18 DIAGNOSIS — R030 Elevated blood-pressure reading, without diagnosis of hypertension: Secondary | ICD-10-CM | POA: Insufficient documentation

## 2023-09-18 DIAGNOSIS — M25511 Pain in right shoulder: Secondary | ICD-10-CM | POA: Insufficient documentation

## 2023-09-21 ENCOUNTER — Telehealth (HOSPITAL_COMMUNITY): Payer: Self-pay | Admitting: Pharmacy Technician

## 2023-09-21 ENCOUNTER — Encounter (HOSPITAL_COMMUNITY): Payer: Self-pay

## 2023-09-21 ENCOUNTER — Emergency Department (HOSPITAL_COMMUNITY)
Admission: EM | Admit: 2023-09-21 | Discharge: 2023-09-21 | Disposition: A | Discharge status: Home / Self Care | Payer: BC Managed Care – PPO | Attending: Emergency Medicine | Admitting: Emergency Medicine

## 2023-09-21 ENCOUNTER — Other Ambulatory Visit (HOSPITAL_COMMUNITY): Payer: Self-pay

## 2023-09-21 ENCOUNTER — Ambulatory Visit
Admission: EM | Admit: 2023-09-21 | Discharge: 2023-09-21 | Disposition: A | Discharge status: Home / Self Care | Payer: BC Managed Care – PPO | Attending: Internal Medicine | Admitting: Internal Medicine

## 2023-09-21 DIAGNOSIS — I4891 Unspecified atrial fibrillation: Secondary | ICD-10-CM | POA: Diagnosis not present

## 2023-09-21 DIAGNOSIS — R0789 Other chest pain: Secondary | ICD-10-CM | POA: Diagnosis present

## 2023-09-21 DIAGNOSIS — Z7901 Long term (current) use of anticoagulants: Secondary | ICD-10-CM | POA: Diagnosis not present

## 2023-09-21 LAB — COMPREHENSIVE METABOLIC PANEL
ALT: 15 U/L (ref 0–44)
AST: 16 U/L (ref 15–41)
Albumin: 3.3 g/dL — ABNORMAL LOW (ref 3.5–5.0)
Alkaline Phosphatase: 59 U/L (ref 38–126)
Anion gap: 10 (ref 5–15)
BUN: 18 mg/dL (ref 6–20)
CO2: 22 mmol/L (ref 22–32)
Calcium: 8.7 mg/dL — ABNORMAL LOW (ref 8.9–10.3)
Chloride: 106 mmol/L (ref 98–111)
Creatinine, Ser: 1.05 mg/dL (ref 0.61–1.24)
GFR, Estimated: >60 mL/min (ref >60)
Glucose, Bld: 105 mg/dL — ABNORMAL HIGH (ref 70–99)
Potassium: 3.8 mmol/L (ref 3.5–5.1)
Sodium: 138 mmol/L (ref 135–145)
Total Bilirubin: 0.5 mg/dL (ref <1.2)
Total Protein: 6.4 g/dL — ABNORMAL LOW (ref 6.5–8.1)

## 2023-09-21 LAB — TROPONIN I (HIGH SENSITIVITY): Troponin I (High Sensitivity): 8 ng/L (ref <18)

## 2023-09-21 LAB — CBC
HCT: 45.9 % (ref 39.0–52.0)
Hemoglobin: 15.7 g/dL (ref 13.0–17.0)
MCH: 31.8 pg (ref 26.0–34.0)
MCHC: 34.2 g/dL (ref 30.0–36.0)
MCV: 93.1 fL (ref 80.0–100.0)
Platelets: 233 10*3/uL (ref 150–400)
RBC: 4.93 MIL/uL (ref 4.22–5.81)
RDW: 12.8 % (ref 11.5–15.5)
WBC: 5.1 10*3/uL (ref 4.0–10.5)
nRBC: 0 % (ref 0.0–0.2)

## 2023-09-21 LAB — D-DIMER, QUANTITATIVE: D-Dimer, Quant: 0.5 ug{FEU}/mL (ref 0.00–0.50)

## 2023-09-21 MED ORDER — PROPOFOL 10 MG/ML IV BOLUS
0.5000 mg/kg | Freq: Once | INTRAVENOUS | Status: DC
  Filled 2023-09-21: qty 20

## 2023-09-21 MED ORDER — RIVAROXABAN 10 MG PO TABS
20.0000 mg | ORAL_TABLET | Freq: Every day | ORAL | Status: DC
  Administered 2023-09-21: 20 mg via ORAL
  Filled 2023-09-21: qty 2

## 2023-09-21 MED ORDER — RIVAROXABAN 20 MG PO TABS: 20.0000 mg | ORAL_TABLET | Freq: Every day | ORAL | 0 refills | Status: DC

## 2023-09-21 MED ORDER — METOPROLOL TARTRATE 50 MG PO TABS: 25.0000 mg | ORAL_TABLET | Freq: Two times a day (BID) | ORAL | 0 refills | Status: DC

## 2023-09-21 MED ORDER — METOPROLOL TARTRATE 25 MG PO TABS
12.5000 mg | ORAL_TABLET | Freq: Two times a day (BID) | ORAL | Status: DC
  Administered 2023-09-21: 12.5 mg via ORAL
  Filled 2023-09-21: qty 1

## 2023-09-21 NOTE — ED Notes (Signed)
Patient discharged by this RN. Patient ambulatory to lobby at time of discharge.

## 2023-09-21 NOTE — ED Triage Notes (Signed)
Pt is having chest pain for the last 5 days, states it worsens with movement. He was given 324asa prior to arrival, Urgent care found him to be in Afib rvr. No nausea, no shortness of breath. No Hx of afib rvr  150/102 98%ra 124bgl 18rr  18g left ac

## 2023-09-21 NOTE — ED Notes (Signed)
Patient is being discharged from the Urgent Care and sent to the Emergency Department via Ambulance . Per Provider, patient is in need of higher level of care due to A-Fib & HR. Patient is aware and verbalizes understanding of plan of care.  Vitals:   09/21/23 0945  BP: 138/89  Pulse: 80  Resp: 18  Temp: 98.2 F (36.8 C)  SpO2: 98%

## 2023-09-21 NOTE — Telephone Encounter (Signed)
Patient Product/process development scientist completed.    The patient is insured through [employer]. Patient has [employer], may use a copay card, and/or apply for patient assistance if available.    Ran test claim for Eliquis 5 mg and the current 30 day co-pay is $125.00.  Ran test claim for Xarelto 20 mg and the current 30 day co-pay is $125.00.  This test claim was processed through [facility]- copay amounts may vary at other pharmacies due to pharmacy/plan contracts, or as the patient moves through the different stages of their insurance plan.     Roland Earl, CPHT Pharmacy Technician III Certified Patient Advocate [facility] Patient Advocate Team Direct Number: [phone]  Fax: [phone]

## 2023-09-21 NOTE — Discharge Instructions (Addendum)
Please take medications as prescribed A-fib clinic should call you for follow-up if you have not heard from them call them next week Return to the emergency department if you are having any worsening chest pain, shortness of breath, generalized weakness or other new or worsening symptoms

## 2023-09-21 NOTE — ED Provider Notes (Signed)
Patient presents to care with reports of chest pain for the last 5 days.  He reports he was unable to get out of bed this morning.  He reports a stabbing pain to his chest.  On evaluation with EKG he is in A-fib with RVR.  He denies any history of A-fib.  EMS notified for transport.     Tomi Bamberger, PA-C 09/21/23 1011

## 2023-09-21 NOTE — ED Triage Notes (Signed)
"  I am having chest pain that I have had for about 5 days but unable to get out of bed even this morning". Today the pain is stabbing and hurting a lot "I may have just strained something over the holiday's". No sob (known). No Wheezing. No recent illness.

## 2023-09-21 NOTE — ED Provider Notes (Signed)
[place] EMERGENCY DEPARTMENT AT [facility] Provider Note   CSN: [id number] Arrival date & time: 09/21/23  1054     History  Chief Complaint  Patient presents with   Chest Pain    Alfred Wyatt is a 47 y.o. male.  HPI 47 year old male presents today complaining of right-sided chest pain for 5 days.  He presented to urgent care.  He was found to be in A-fib with RVR.  EMS was called to transfer patient to ED.  Patient is not having any other chest pain.  He identifies this as pain in his chest that he associates with chest wall pain.  He states that he started having it about 5 days ago after doing heavy lifting.  He has not had any dyspnea, productive cough, fever, chills, lightheadedness.  He states he did take some deep breaths feeling like he needed to clear his throat.  He denies any history of PE or DVT or prior A-fib.  Patient does smoke and he does drink occasionally especially on the weekends.  He states he takes 5 large shots most weekends.  Yesterday he reports he only had 1 shot of alcohol.    Home Medications Prior to Admission medications   Medication Sig Start Date End Date Taking? Authorizing Provider  metoprolol tartrate (LOPRESSOR) 50 MG tablet Take 0.5 tablets (25 mg total) by mouth 2 (two) times daily. 09/21/23  Yes Margarita Grizzle, MD  rivaroxaban (XARELTO) 20 MG TABS tablet Take 1 tablet (20 mg total) by mouth daily with supper. 09/21/23  Yes Margarita Grizzle, MD  amoxicillin-clavulanate (AUGMENTIN) 875-125 MG tablet Take 1 tablet by mouth 2 (two) times daily. 08/22/23   [provider]      Allergies    Patient has no known allergies.    Review of Systems   Review of Systems  Physical Exam Updated Vital Signs BP (!) 154/120   Pulse 100   Resp 18   SpO2 100%  Physical Exam Vitals reviewed.  HENT:     Head: Normocephalic.  Eyes:     Pupils: Pupils are equal, round, and reactive to light.  Cardiovascular:     Rate and Rhythm:  Tachycardia present. Rhythm irregular.     Heart sounds: Normal heart sounds.  Pulmonary:     Effort: Pulmonary effort is normal.     Breath sounds: Normal breath sounds.  Chest:     Chest wall: Tenderness present.     Comments: Some tenderness right anterior chest wall consistent with where patient is having pain Abdominal:     General: Bowel sounds are normal.     Palpations: Abdomen is soft.  Musculoskeletal:        General: Normal range of motion.     Cervical back: Normal range of motion.  Skin:    General: Skin is warm and dry.  Neurological:     General: No focal deficit present.     Mental Status: He is alert.     ED Results / Procedures / Treatments   Labs (all labs ordered are listed, but only abnormal results are displayed) Labs Reviewed  COMPREHENSIVE METABOLIC PANEL - Abnormal; Notable for the following components:      Result Value   Glucose, Bld 105 (*)    Calcium 8.7 (*)    Total Protein 6.4 (*)    Albumin 3.3 (*)    All other components within normal limits  CBC  D-DIMER, QUANTITATIVE  TROPONIN I (HIGH SENSITIVITY)  EKG EKG Interpretation Date/Time:  Thursday September 21 2023 11:04:17 EST Ventricular Rate:  127 PR Interval:    QRS Duration:  80 QT Interval:  310 QTC Calculation: 451 R Axis:   14  Text Interpretation: Atrial fibrillation Confirmed by Margarita Grizzle [phone]) on 09/21/2023 11:14:18 AM  Radiology No results found.  Procedures Procedures    Medications Ordered in ED Medications  propofol (DIPRIVAN) 10 mg/mL bolus/IV push 46.5 mg (46.5 mg Intravenous Not Given 09/21/23 1133)  metoprolol tartrate (LOPRESSOR) tablet 12.5 mg (12.5 mg Oral Given 09/21/23 1237)  rivaroxaban (XARELTO) tablet 20 mg (20 mg Oral Given 09/21/23 1237)    ED Course/ Medical Decision Making/ A&P Clinical Course as of 09/21/23 1324  Thu Sep 21, 2023  1208 Interpreted and within normal limits [DR]  1315 Complete metabolic panel reviewed interpreted  significant for mild hypocalcemia [DR]  1316 D-dimer 0.5 normal Troponin 8 normal CBC reviewed interpreted and normal [DR]    Clinical Course User Index [DR] Margarita Grizzle, MD         CHA2DS2-VASc Score: 0                        Medical Decision Making Amount and/or Complexity of Data Reviewed Labs: ordered.  Risk Prescription drug management.   Patient presents today with new onset A-fib and right-sided chest pain.  Differential diagnosis includes but is not limited to A-fib from cardiac disease, holiday heart, chest pain secondary to A-fib, chest wall pain. Patient was evaluated here with EKG which is consistent with A-fib.  Onset of symptoms are unclear and likely as long as 5 days ago. CBC obtained no evidence of anemia or other primary etiology of A-fib Complete metabolic panel reviewed interpreted with normal electrolytes Troponin is normal and patient has had pain ongoing for 5 days doubt acute coronary syndrome Patient has a normal D-dimer doubt PE Patient treated here with metoprolol.  Heart rate has decreased to 100.  Blood pressure has remained stable at 140/100   CHA2DS2-VASc score calculated and is 0 Patient is anticoagulated here in the emergency department with rivaroxaban. I have discussed outpatient precautions, return precautions, need for close follow-up Patient will be placed on light duty. We discussed need to continue the rivaroxaban until follow-up We have discussed return precautions We have discussed risks and benefits of anticoagulation and he voices understanding of risks. We have discussed cessation of alcohol and tobacco use       Final Clinical Impression(s) / ED Diagnoses Final diagnoses:  Atrial fibrillation, unspecified type (HCC)    Rx / DC Orders ED Discharge Orders          Ordered    metoprolol tartrate (LOPRESSOR) 50 MG tablet  2 times daily        09/21/23 1322    rivaroxaban (XARELTO) 20 MG TABS tablet  Daily with supper         09/21/23 1322              Margarita Grizzle, MD 09/21/23 1324

## 2023-09-25 ENCOUNTER — Encounter (HOSPITAL_COMMUNITY): Payer: Self-pay | Admitting: Physician Assistant

## 2023-09-25 ENCOUNTER — Ambulatory Visit (HOSPITAL_COMMUNITY)
Admission: RE | Admit: 2023-09-25 | Discharge: 2023-09-25 | Disposition: A | Discharge status: Home / Self Care | Payer: BC Managed Care – PPO | Source: Ambulatory Visit | Attending: Physician Assistant | Admitting: Physician Assistant

## 2023-09-25 VITALS — BP 132/88 | HR 94 | Ht 73.0 in | Wt 201.6 lb

## 2023-09-25 DIAGNOSIS — I491 Atrial premature depolarization: Secondary | ICD-10-CM | POA: Diagnosis not present

## 2023-09-25 DIAGNOSIS — I480 Paroxysmal atrial fibrillation: Secondary | ICD-10-CM | POA: Diagnosis not present

## 2023-09-25 DIAGNOSIS — Z79899 Other long term (current) drug therapy: Secondary | ICD-10-CM | POA: Insufficient documentation

## 2023-09-25 HISTORY — DX: Unspecified atrial fibrillation: I48.91

## 2023-09-25 MED ORDER — METOPROLOL SUCCINATE ER 25 MG PO TB24: 25.0000 mg | ORAL_TABLET | Freq: Every day | ORAL | 3 refills | Status: DC

## 2023-09-25 NOTE — Patient Instructions (Signed)
Stop metoprolol tartrate    Start metoprolol succinate 25mg once a day

## 2023-09-25 NOTE — Progress Notes (Signed)
Primary Care Physician: Patient, No Pcp Per Primary Cardiologist: None Electrophysiologist: None  Referring Physician: ED   Alfred Wyatt is a 47 y.o. male with a history of atrial fibrillation who presents for consultation in the [facility].  The patient was initially diagnosed with atrial fibrillation 09/21/23 after presenting to urgent care complaining of right-sided chest pain for 5 days. He was found to be in A-fib with RVR. EMS was called to transfer patient to ED. He was given metoprolol for rate control and started on Xarelto for a CHADS2VASC score of 0.  Today, patient spontaneously converted to SR. His CP resolved and his energy returned to normal. Patient does admit to drinking heavily on weekends. He denies snoring or daytime somnolence. He did discontinue Xarelto on his own due to nose bleeds.   Today, he denies symptoms of palpitations, chest pain, shortness of breath, orthopnea, PND, lower extremity edema, dizziness, presyncope, syncope, snoring, daytime somnolence, bleeding, or neurologic sequela. The patient is tolerating medications without difficulties and is otherwise without complaint today.    Atrial Fibrillation Risk Factors:  he does not have symptoms or diagnosis of sleep apnea. he does not have a history of rheumatic fever. he does have a history of alcohol use. The patient does not have a history of early familial atrial fibrillation or other arrhythmias.  Atrial Fibrillation Management history:  Previous antiarrhythmic drugs: none Previous cardioversions: none Previous ablations: none Anticoagulation history: Xarelto   ROS- All systems are reviewed and negative except as per the HPI above.  Past Medical History:  Diagnosis Date   Atrial fibrillation [facility])     Current Outpatient Medications  Medication Sig Dispense Refill   amoxicillin-clavulanate (AUGMENTIN) 875-125 MG tablet Take 1 tablet by mouth 2 (two) times  daily.     metoprolol succinate (TOPROL XL) 25 MG 24 hr tablet Take 1 tablet (25 mg total) by mouth daily. 30 tablet 3   No current facility-administered medications for this encounter.    Physical Exam: BP 132/88   Pulse 94   Ht 6\' 1"  (1.854 m)   Wt 91.4 kg   BMI 26.60 kg/m   GEN: Well nourished, well developed in no acute distress NECK: No JVD; No carotid bruits CARDIAC: Regular rate and rhythm, no murmurs, rubs, gallops RESPIRATORY:  Clear to auscultation without rales, wheezing or rhonchi  ABDOMEN: Soft, non-tender, non-distended EXTREMITIES:  No edema; No deformity   Wt Readings from Last 3 Encounters:  09/25/23 91.4 kg  09/21/23 93 kg  05/01/23 93 kg     EKG today demonstrates  SR, PAC Vent. rate 94 BPM PR interval 146 ms QRS duration 82 ms QT/QTcB 344/430 ms    CHA2DS2-VASc Score = 0  The patient's score is based upon: CHF History: 0 HTN History: 0 Diabetes History: 0 Stroke History: 0 Vascular Disease History: 0 Age Score: 0 Gender Score: 0        ASSESSMENT AND PLAN: Paroxysmal Atrial Fibrillation (ICD10:  I48.0) The patient's CHA2DS2-VASc score is 0, indicating a 0.2% annual risk of stroke.   General education about afib provided and questions answered. We also discussed his stroke risk and the risks and benefits of anticoagulation. Patient has spontaneously converted to SR. Check echocardiogram Will not resume anticoagulation given his low CV score.  Will change Lopressor to Toprol 25 mg daily for ease of dosing    Follow up in the AF clinic in 6 weeks.  Jorja Loa PA-C Afib Clinic [facility] [address] [place], [place] [address] [phone]

## 2023-10-03 ENCOUNTER — Emergency Department (HOSPITAL_COMMUNITY): Payer: BC Managed Care – PPO

## 2023-10-03 ENCOUNTER — Emergency Department (HOSPITAL_COMMUNITY)
Admission: EM | Admit: 2023-10-03 | Discharge: 2023-10-03 | Disposition: A | Discharge status: Home / Self Care | Payer: BC Managed Care – PPO | Attending: Emergency Medicine | Admitting: Emergency Medicine

## 2023-10-03 ENCOUNTER — Other Ambulatory Visit: Payer: Self-pay

## 2023-10-03 ENCOUNTER — Encounter (HOSPITAL_COMMUNITY): Payer: Self-pay

## 2023-10-03 DIAGNOSIS — R0789 Other chest pain: Secondary | ICD-10-CM | POA: Insufficient documentation

## 2023-10-03 DIAGNOSIS — R7989 Other specified abnormal findings of blood chemistry: Secondary | ICD-10-CM | POA: Insufficient documentation

## 2023-10-03 DIAGNOSIS — R079 Chest pain, unspecified: Secondary | ICD-10-CM | POA: Diagnosis present

## 2023-10-03 LAB — CBC
HCT: 43.7 % (ref 39.0–52.0)
Hemoglobin: 14.9 g/dL (ref 13.0–17.0)
MCH: 31.9 pg (ref 26.0–34.0)
MCHC: 34.1 g/dL (ref 30.0–36.0)
MCV: 93.6 fL (ref 80.0–100.0)
Platelets: 245 10*3/uL (ref 150–400)
RBC: 4.67 MIL/uL (ref 4.22–5.81)
RDW: 13.2 % (ref 11.5–15.5)
WBC: 5.5 10*3/uL (ref 4.0–10.5)
nRBC: 0 % (ref 0.0–0.2)

## 2023-10-03 LAB — BASIC METABOLIC PANEL
Anion gap: 10 (ref 5–15)
BUN: 17 mg/dL (ref 6–20)
CO2: 26 mmol/L (ref 22–32)
Calcium: 8.9 mg/dL (ref 8.9–10.3)
Chloride: 101 mmol/L (ref 98–111)
Creatinine, Ser: 1.33 mg/dL — ABNORMAL HIGH (ref 0.61–1.24)
GFR, Estimated: >60 mL/min (ref >60)
Glucose, Bld: 96 mg/dL (ref 70–99)
Potassium: 3.7 mmol/L (ref 3.5–5.1)
Sodium: 137 mmol/L (ref 135–145)

## 2023-10-03 LAB — TROPONIN I (HIGH SENSITIVITY)
Troponin I (High Sensitivity): 5 ng/L (ref <18)
Troponin I (High Sensitivity): 6 ng/L (ref <18)

## 2023-10-03 MED ORDER — IBUPROFEN 400 MG PO TABS
600.0000 mg | ORAL_TABLET | Freq: Once | ORAL | Status: AC
  Administered 2023-10-03: 600 mg via ORAL
  Filled 2023-10-03: qty 1

## 2023-10-03 MED ORDER — LIDOCAINE 5 % EX PTCH
1.0000 | MEDICATED_PATCH | CUTANEOUS | Status: DC
  Administered 2023-10-03: 1 via TRANSDERMAL
  Filled 2023-10-03: qty 1

## 2023-10-03 NOTE — ED Notes (Signed)
 Pt has increasing shortness of breath when he was brought back for reassessment.

## 2023-10-03 NOTE — Discharge Instructions (Addendum)
 It was a pleasure taking care of you today.  As discussed, your workup was reassuring.  Please follow-up with the A-fib clinic for further evaluation.  Continue taking your metoprolol  as previously prescribed.  You may take over-the-counter ibuprofen  or Tylenol  as needed for pain.  You may also purchase Lidoderm  patches and Voltaren  gel for added pain relief.  Return to the ER for any new or worsening symptoms.

## 2023-10-03 NOTE — ED Triage Notes (Signed)
 Pt reports that he was recently diagnosed with an irregular heart rate and this morning on his way to work he started having left sided chest pain that radiated down his arm. Pt reports taking medications as prescribed.

## 2023-10-03 NOTE — ED Provider Notes (Signed)
 [facility] Provider Note   CSN: [id number] Arrival date & time: 10/03/23  0349     History  Chief Complaint  Patient presents with   Chest Pain    Alfred Wyatt is a 48 y.o. male with a past medical history significant for new onset paroxysmal A-fib diagnosed on 12/26 who presents to the ED due to left-sided chest pain associated with palpitations that started early this morning.  Patient states left-sided chest pain is worse with movement of left arm.  Patient is currently chest pain-free however, admits to some pain behind left shoulder.  Typically lifts heavy objects for work however, has not been to work since being diagnosed with A-fib on 12/26. Denies injury to left shoulder. Was initially placed on Xarelto  after his ED visit however, was seen by the A-fib clinic on 12/30 and was discontinued on Xarelto .  Patient was switched from Lopressor  to metoprolol  which he has been taking.  No history of blood clots.  D-dimer on 12/26 was normal.  Denies shortness of breath.  Patient states he was feeling anxious when he developed the chest pain which could be contributing to his elevated heart rate.  Chart review, patient drinks heavily on the weekends. Denies lower extremity edema.   History obtained from patient and past medical records. No interpreter used during encounter.       Home Medications Prior to Admission medications   Medication Sig Start Date End Date Taking? Authorizing Provider  metoprolol  succinate (TOPROL  XL) 25 MG 24 hr tablet Take 1 tablet (25 mg total) by mouth daily. 09/25/23  Yes Fenton, Clint R, PA      Allergies    Patient has no known allergies.    Review of Systems   Review of Systems  Constitutional:  Negative for chills and fever.  Respiratory:  Negative for shortness of breath.   Cardiovascular:  Positive for chest pain and palpitations. Negative for leg swelling.  Gastrointestinal:  Negative for  abdominal pain.    Physical Exam Updated Vital Signs BP (!) 148/105   Pulse 64   Temp 97.8 F (36.6 C)   Resp 20   Ht 6' 1 (1.854 m)   Wt 90.7 kg   SpO2 100%   BMI 26.39 kg/m  Physical Exam Vitals and nursing note reviewed.  Constitutional:      General: He is not in acute distress.    Appearance: He is not ill-appearing.  HENT:     Head: Normocephalic.  Eyes:     Pupils: Pupils are equal, round, and reactive to light.  Cardiovascular:     Rate and Rhythm: Normal rate and regular rhythm.     Pulses: Normal pulses.     Heart sounds: Normal heart sounds. No murmur heard.    No friction rub. No gallop.  Pulmonary:     Effort: Pulmonary effort is normal.     Breath sounds: Normal breath sounds.  Abdominal:     General: Abdomen is flat. There is no distension.     Palpations: Abdomen is soft.     Tenderness: There is no abdominal tenderness. There is no guarding or rebound.  Musculoskeletal:        General: Normal range of motion.     Cervical back: Neck supple.     Comments: Reproducible tenderness to posterior aspect of the left shoulder.  Full range of motion of left shoulder.  No erythema, edema, or warmth. No lower extremity edema.  Skin:    General: Skin is warm and dry.  Neurological:     General: No focal deficit present.     Mental Status: He is alert.  Psychiatric:        Mood and Affect: Mood normal.        Behavior: Behavior normal.     ED Results / Procedures / Treatments   Labs (all labs ordered are listed, but only abnormal results are displayed) Labs Reviewed  BASIC METABOLIC PANEL - Abnormal; Notable for the following components:      Result Value   Creatinine, Ser 1.33 (*)    All other components within normal limits  CBC  TROPONIN I (HIGH SENSITIVITY)  TROPONIN I (HIGH SENSITIVITY)    EKG EKG Interpretation Date/Time:  Tuesday October 03 2023 04:00:10 EST Ventricular Rate:  73 PR Interval:  152 QRS Duration:  88 QT  Interval:  374 QTC Calculation: 412 R Axis:   5  Text Interpretation: Sinus rhythm with Premature atrial complexes Otherwise normal ECG When compared with ECG of 25-Sep-2023 15:32, PREVIOUS ECG IS PRESENT Confirmed by Kommor, Madison ([id number]) on 10/03/2023 8:14:26 AM  Radiology DG Chest 2 View Result Date: 10/03/2023 CLINICAL DATA:  Chest pain, onset tonight. EXAM: CHEST - 2 VIEW COMPARISON:  Chest PA Lat 01/31/2017 FINDINGS: The heart size and mediastinal contours are within normal limits. Both lungs are clear. The visualized skeletal structures are unremarkable. IMPRESSION: No evidence of acute chest disease or interval changes. Electronically Signed   By: Francis Quam M.D.   On: 10/03/2023 04:43    Procedures Procedures    Medications Ordered in ED Medications  lidocaine  (LIDODERM ) 5 % 1 patch (1 patch Transdermal Patch Applied 10/03/23 0751)  ibuprofen  (ADVIL ) tablet 600 mg (600 mg Oral Given 10/03/23 0751)    ED Course/ Medical Decision Making/ A&P                                 Medical Decision Making Amount and/or Complexity of Data Reviewed External Data Reviewed: notes.    Details: A. Fib clinic note Labs: ordered. Decision-making details documented in ED Course. Radiology: ordered and independent interpretation performed. Decision-making details documented in ED Course. ECG/medicine tests: ordered and independent interpretation performed. Decision-making details documented in ED Course.  Risk Prescription drug management.   This patient presents to the ED for concern of CP, this involves an extensive number of treatment options, and is a complaint that carries with it a high risk of complications and morbidity.  The differential diagnosis includes ACS, Dissection, PE, PNA, MSK etiology, etc  48 year old male presents to the ED due to left-sided chest pain associated with palpitations that started early this morning.  Recently diagnosed with paroxysmal A-fib on 12/26.  Saw  A-fib clinic on 12/30 and was advised to discontinue his Xarelto .  Currently on metoprolol  which he has been compliant with.  Denies associated shortness of breath and lower extremity edema.  D-dimer on 12/26 was normal.  No history of blood clots.  During initial evaluation, patient chest pain-free.  Upon arrival patient afebrile, not tachycardic or hypoxic.  Patient in no acute distress.  Reassuring physical exam.  Reproducible tenderness to posterior aspect of left shoulder.  No lower extremity edema.  Negative Homan sign bilaterally.  Low suspicion for PE/DVT.  Presentation nonconcerning for aortic dissection.  Possible MSK etiology even reproducible nature on exam. No injury to suggest bony fracture, so  will hold off on x-ray. No evidence of septic joint. Cardiac labs ordered in triage.  Troponin x 2 normal. EKG sinus rhythm with PACs. No signs of acute ischemia. CBC unremarkable.  No leukocytosis.  Normal hemoglobin.  BMP significant for elevated creatinine 1.3 however, otherwise unremarkable.  Chest x-ray personally reviewed and interpreted which is negative for signs of pneumonia, pneumothorax or widened mediastinum.  Patient has remained chest pain-free during his entire ED stay.  No ischemic changes on EKG.  Low suspicion for ACS.  Presentation nonconcerning for aortic dissection. No abnormal rhythms on cardiac monitoring.  Low suspicion for PE/DVT.  Possible MSK etiology given reproducible nature on exam.  Unclear whether or not palpitations related to anxiety versus A-fib.  Advised patient to follow-up with A-fib clinic for further evaluation.  Patient stable for discharge. Strict ED precautions discussed with patient. Patient states understanding and agrees to plan. Patient discharged home in no acute distress and stable vitals.  Co morbidities that complicate the patient evaluation  Paroxysmal A-fib Cardiac Monitoring: / EKG:  The patient was maintained on a cardiac monitor.  I personally  viewed and interpreted the cardiac monitored which showed an underlying rhythm of: NSR with PACs  Social Determinants of Health:  No PCP on file  Test / Admission - Considered:  No evidence of ACS on EKG/labs and currently chest pain free, does not require admission    CHA2DS2-VASc Score = 0         Final Clinical Impression(s) / ED Diagnoses Final diagnoses:  Atypical chest pain    Rx / DC Orders ED Discharge Orders     None         Lorelle Aleck JAYSON DEVONNA 10/03/23 0836    Albertina Dixon, MD 10/03/23 2145

## 2023-10-16 ENCOUNTER — Ambulatory Visit (HOSPITAL_COMMUNITY)
Admission: RE | Admit: 2023-10-16 | Discharge: 2023-10-16 | Disposition: A | Discharge status: Home / Self Care | Payer: BC Managed Care – PPO | Source: Ambulatory Visit | Attending: Physician Assistant

## 2023-10-16 DIAGNOSIS — I480 Paroxysmal atrial fibrillation: Secondary | ICD-10-CM | POA: Diagnosis present

## 2023-10-16 LAB — ECHOCARDIOGRAM COMPLETE
AR max vel: 2.94 cm2
AV Area VTI: 2.93 cm2
AV Area mean vel: 3.08 cm2
AV Mean grad: 3 mm[Hg]
AV Peak grad: 5.7 mm[Hg]
Ao pk vel: 1.19 m/s
Area-P 1/2: 3.24 cm2
S' Lateral: 3 cm

## 2023-10-17 ENCOUNTER — Encounter (HOSPITAL_COMMUNITY): Payer: Self-pay

## 2023-11-06 ENCOUNTER — Encounter (HOSPITAL_COMMUNITY): Payer: Self-pay

## 2023-11-06 ENCOUNTER — Ambulatory Visit (HOSPITAL_COMMUNITY): Payer: BC Managed Care – PPO | Admitting: Physician Assistant

## 2023-11-26 ENCOUNTER — Encounter: Payer: Self-pay | Admitting: *Deleted

## 2023-11-26 ENCOUNTER — Ambulatory Visit
Admission: EM | Admit: 2023-11-26 | Discharge: 2023-11-26 | Disposition: A | Discharge status: Home / Self Care | Attending: Physician Assistant | Admitting: Physician Assistant

## 2023-11-26 ENCOUNTER — Other Ambulatory Visit: Payer: Self-pay

## 2023-11-26 DIAGNOSIS — J069 Acute upper respiratory infection, unspecified: Secondary | ICD-10-CM

## 2023-11-26 DIAGNOSIS — R6889 Other general symptoms and signs: Secondary | ICD-10-CM | POA: Diagnosis not present

## 2023-11-26 LAB — POC COVID19/FLU A&B COMBO
Covid Antigen, POC: NEGATIVE
Influenza A Antigen, POC: NEGATIVE
Influenza B Antigen, POC: NEGATIVE

## 2023-11-26 MED ORDER — OSELTAMIVIR PHOSPHATE 75 MG PO CAPS: 75.0000 mg | ORAL_CAPSULE | Freq: Two times a day (BID) | ORAL | 0 refills | Status: DC

## 2023-11-26 NOTE — ED Triage Notes (Signed)
 Pt reports since this morning he has had chills,fever body aches .

## 2023-11-26 NOTE — ED Provider Notes (Signed)
 [name]    CSN: [id number] Arrival date & time: 11/26/23  0810      History   Chief Complaint Chief Complaint  Patient presents with   Generalized Body Aches   Chills   Fever    HPI Alfred Wyatt is a 48 y.o. male.   Patient here today for evaluation of bodyaches, chills, fever and congestion that started this morning.  He denies any vomiting.  He has taken Mucinex without significant improvement.  The history is provided by the patient.  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 Authorizing Provider  oseltamivir (TAMIFLU) 75 MG capsule Take 1 capsule (75 mg total) by mouth every 12 (twelve) hours. 11/26/23  Yes Tomi Bamberger, PA-C  metoprolol succinate (TOPROL XL) 25 MG 24 hr tablet Take 1 tablet (25 mg total) by mouth daily. 09/25/23   Fenton, Laural Benes, PA    Family History History reviewed. No pertinent family history.  Social History Social History   Tobacco Use   Smoking status: Every Day    Current packs/day: 0.50    Types: Cigarettes   Smokeless tobacco: Never   Tobacco comments:    1/2 ppd 09/25/23  Vaping Use   Vaping status: Every Day  Substance Use Topics   Alcohol use: Yes    Alcohol/week: 6.0 standard drinks of alcohol    Types: 6 Standard drinks or  equivalent per week    Comment: 6 mixed drinks weekly 09/25/23   Drug use: No     Allergies   Patient has no known allergies.   Review of Systems Review of Systems  Constitutional:  Positive for chills and fever.  HENT:  Positive for congestion and sore throat. Negative for ear pain.   Eyes:  Negative for discharge and redness.  Respiratory:  Positive for cough. Negative for shortness of breath.   Gastrointestinal:  Negative for abdominal pain, nausea and vomiting.     Physical Exam Triage Vital Signs ED Triage Vitals  Encounter Vitals Group     BP 11/26/23 0838 (!) 151/100     Systolic BP Percentile --      Diastolic BP Percentile --      Pulse Rate 11/26/23 0838 80     Resp 11/26/23 0838 18     Temp 11/26/23 0838 98.4 F (36.9 C)     Temp src --      SpO2 11/26/23 0838 97 %     Weight --      Height --      Head Circumference --      Peak Flow --      Pain Score 11/26/23 0836 0     Pain Loc --      Pain Education --  Exclude from Growth Chart --    No data found.  Updated Vital Signs BP (!) 151/100   Pulse 80   Temp 98.4 F (36.9 C)   Resp 18   SpO2 97%   Visual Acuity Right Eye Distance:   Left Eye Distance:   Bilateral Distance:    Right Eye Near:   Left Eye Near:    Bilateral Near:     Physical Exam Vitals and nursing note reviewed.  Constitutional:      General: He is not in acute distress.    Appearance: Normal appearance. He is not ill-appearing.  HENT:     Head: Normocephalic and atraumatic.     Right Ear: Tympanic membrane normal.     Left Ear: Tympanic membrane normal.     Nose: Congestion present.     Mouth/Throat:     Mouth: Mucous membranes are moist.     Pharynx: Oropharynx is clear. No oropharyngeal exudate or posterior oropharyngeal erythema.  Eyes:     Conjunctiva/sclera: Conjunctivae normal.  Cardiovascular:     Rate and Rhythm: Normal rate and regular rhythm.     Heart sounds: Normal heart sounds. No murmur  heard. Pulmonary:     Effort: Pulmonary effort is normal. No respiratory distress.     Breath sounds: Normal breath sounds. No wheezing, rhonchi or rales.  Skin:    General: Skin is warm and dry.  Neurological:     Mental Status: He is alert.  Psychiatric:        Mood and Affect: Mood normal.        Thought Content: Thought content normal.      UC Treatments / Results  Labs (all labs ordered are listed, but only abnormal results are displayed) Labs Reviewed  POC COVID19/FLU A&B COMBO - Normal    EKG   Radiology No results found.  Procedures Procedures (including critical care time)  Medications Ordered in UC Medications - No data to display  Initial Impression / Assessment and Plan / UC Course  I have reviewed the triage vital signs and the nursing notes.  Pertinent labs & imaging results that were available during my care of the patient were reviewed by me and considered in my medical decision making (see chart for details).    COVID and flu screening negative however given short duration of symptoms possible false negative due to early screening.  Discussed options with patient, and he prefers to treat to cover flu given flulike symptoms.  Encouraged symptomatic treatment otherwise, increase fluids and rest with follow-up if no gradual improvement with any further concerns.  Final Clinical Impressions(s) / UC Diagnoses   Final diagnoses:  Acute upper respiratory infection  Flu-like symptoms   Discharge Instructions   None    ED Prescriptions     Medication Sig Dispense Auth. Provider   oseltamivir (TAMIFLU) 75 MG capsule Take 1 capsule (75 mg total) by mouth every 12 (twelve) hours. 10 capsule Tomi Bamberger, PA-C      PDMP not reviewed this encounter.   Tomi Bamberger, PA-C 11/26/23 [phone]

## 2024-01-29 ENCOUNTER — Ambulatory Visit (INDEPENDENT_AMBULATORY_CARE_PROVIDER_SITE_OTHER)

## 2024-01-29 ENCOUNTER — Ambulatory Visit: Admission: EM | Admit: 2024-01-29 | Discharge: 2024-01-29 | Disposition: A | Discharge status: Home / Self Care

## 2024-01-29 DIAGNOSIS — M79671 Pain in right foot: Secondary | ICD-10-CM

## 2024-01-29 DIAGNOSIS — M25511 Pain in right shoulder: Secondary | ICD-10-CM

## 2024-01-29 NOTE — Discharge Instructions (Signed)
  Please follow up with [facility] as soon as possible for further recommendation/ treatment.   [phone]

## 2024-01-29 NOTE — ED Triage Notes (Signed)
"  I had an operation on my right foot last year, they took a bone out and everything was fine but starting about a month ago I started with intermitted foot pain in this right foot that continues @ the area of surgical procedure".

## 2024-01-29 NOTE — ED Provider Notes (Signed)
 [name]    CSN: [id number] Arrival date & time: 01/29/24  7829      History   Chief Complaint Chief Complaint  Patient presents with   Foot Pain    HPI Alfred Wyatt is a 48 y.o. male.   Patient here today for evaluation of pain to the bottom of his right foot this been ongoing for several years however he had surgery a year ago to remove fifth metatarsal head.  He reports that he was doing well but then a month ago started to have pain in the same area.  He reports he is having difficulty walking due to same.  He lost contact information for his prior surgeon.  The history is provided by the patient.  Foot Pain Pertinent negatives include no shortness of breath.    Past Medical History:  Diagnosis Date   Atrial fibrillation [facility])     Patient Active Problem List   Diagnosis Date Noted   Paroxysmal atrial fibrillation (HCC) 09/25/2023   Elevated blood-pressure reading without diagnosis of hypertension 09/18/2023   Pain in right shoulder 09/18/2023   Pain in joint of right shoulder 09/18/2023   Polyp of nasal cavity 06/30/2023   Allergic rhinitis 06/30/2023   Epistaxis 06/30/2023    Past Surgical History:  Procedure Laterality Date   ANKLE SURGERY         Home Medications    Prior to Admission medications   Medication Sig Start Date End Date Taking? Authorizing Provider  amoxicillin -clavulanate (AUGMENTIN ) 875-125 MG tablet Take 1 tablet every 12 hours by oral route with meal(s) for 7 days.    [provider]  metoprolol  succinate (TOPROL  XL) 25 MG 24 hr tablet Take 1 tablet (25 mg total) by mouth daily. 09/25/23   Fenton, Clint R, PA  metoprolol  tartrate (LOPRESSOR ) 50 MG tablet     [provider]  oseltamivir  (TAMIFLU ) 75 MG capsule Take 1 capsule (75 mg total) by mouth every 12 (twelve) hours. 11/26/23   Vernestine Gondola, PA-C  rivaroxaban  (XARELTO ) 20 MG TABS tablet     [provider]    Family  History History reviewed. No pertinent family history.  Social History Social History   Tobacco Use   Smoking status: Every Day    Current packs/day: 0.50    Types: Cigarettes   Smokeless tobacco: Never   Tobacco comments:    1/2 ppd 09/25/23  Vaping Use   Vaping status: Every Day   Substances: Nicotine, Flavoring  Substance Use Topics   Alcohol use: Not Currently    Alcohol/week: 6.0 standard drinks of alcohol    Types: 6 Standard drinks or equivalent per week    Comment: 6 mixed drinks weekly 09/25/23   Drug use: Not Currently     Allergies   Patient has no known allergies.   Review of Systems Review of Systems  Constitutional:  Negative for chills and fever.  Eyes:  Negative for discharge and redness.  Respiratory:  Negative for shortness of breath.   Musculoskeletal:  Positive for arthralgias.  Skin:  Negative for wound.  Neurological:  Negative for numbness.     Physical Exam Triage Vital Signs ED Triage Vitals  Encounter Vitals Group     BP 01/29/24 1104 (!) 135/97     Systolic BP Percentile --      Diastolic BP Percentile --      Pulse Rate 01/29/24 1104 82     Resp 01/29/24 1104 18  Temp 01/29/24 1104 98 F (36.7 C)     Temp Source 01/29/24 1104 Oral     SpO2 01/29/24 1104 97 %     Weight 01/29/24 1103 205 lb (93 kg)     Height 01/29/24 1103 6\' 1"  (1.854 m)     Head Circumference --      Peak Flow --      Pain Score 01/29/24 1101 10     Pain Loc --      Pain Education --      Exclude from Growth Chart --    No data found.  Updated Vital Signs BP (!) 135/97 (BP Location: Right Arm)   Pulse 82   Temp 98 F (36.7 C) (Oral)   Resp 18   Ht 6\' 1"  (1.854 m)   Wt 205 lb (93 kg)   SpO2 97%   BMI 27.05 kg/m   Visual Acuity Right Eye Distance:   Left Eye Distance:   Bilateral Distance:    Right Eye Near:   Left Eye Near:    Bilateral Near:     Physical Exam Vitals and nursing note reviewed.  Constitutional:      General: He is  not in acute distress.    Appearance: Normal appearance. He is not ill-appearing.  HENT:     Head: Normocephalic and atraumatic.  Eyes:     Conjunctiva/sclera: Conjunctivae normal.  Cardiovascular:     Rate and Rhythm: Normal rate.  Pulmonary:     Effort: Pulmonary effort is normal. No respiratory distress.  Musculoskeletal:     Comments: TTP noted to sole of right foot at distal 5th metatarsal where callous is noted  Skin:    Capillary Refill: Normal cap refill to right toes Neurological:     Mental Status: He is alert.     Comments: Gross sensation intact to distal right toes  Psychiatric:        Mood and Affect: Mood normal.        Behavior: Behavior normal.        Thought Content: Thought content normal.      UC Treatments / Results  Labs (all labs ordered are listed, but only abnormal results are displayed) Labs Reviewed - No data to display  EKG   Radiology No results found.  Procedures Procedures (including critical care time)  Medications Ordered in UC Medications - No data to display  Initial Impression / Assessment and Plan / UC Course  I have reviewed the triage vital signs and the nursing notes.  Pertinent labs & imaging results that were available during my care of the patient were reviewed by me and considered in my medical decision making (see chart for details).    X-ray with apparent degenerative changes at the area of surgery.  Recommended further evaluation by his foot specialist and contact information provided for same.  Postop shoe applied in office and work note provided.    Final Clinical Impressions(s) / UC Diagnoses   Final diagnoses:  Right foot pain  Pain in joint of right shoulder     Discharge Instructions       Please follow up with [facility] as soon as possible for further recommendation/ treatment.   [phone]      ED Prescriptions   None    PDMP not reviewed this encounter.   Vernestine Gondola, PA-C 01/29/24 1140

## 2024-01-31 ENCOUNTER — Ambulatory Visit: Admitting: Podiatry

## 2024-01-31 DIAGNOSIS — D2371 Other benign neoplasm of skin of right lower limb, including hip: Secondary | ICD-10-CM

## 2024-01-31 NOTE — Progress Notes (Signed)
  Subjective:  Patient ID: Alfred Wyatt, male    DOB: 11/15/1975,   MRN: [id number]  No chief complaint on file.   48 y.o. male presents for reoccurrence of lesion on right foot. Relates it has come back. Has had previous fifth metatarsal osteotomy on right. States it was doing well for a while but the past month it has returned and very painful . Denies any other pedal complaints. Denies n/v/f/c.   Past Medical History:  Diagnosis Date   Atrial fibrillation (HCC)     Objective:  Physical Exam: Vascular: DP/PT pulses 2/4 bilateral. CFT <3 seconds. Normal hair growth on digits. No edema.  Skin. No lacerations or abrasions bilateral feet.  Hyperkeratotic cored lesion noted sub fifth metatarsal head with disruption of skin lines.  Musculoskeletal: MMT 5/5 bilateral lower extremities in DF, PF, Inversion and Eversion. Deceased ROM in DF of ankle joint.  Neurological: Sensation intact to light touch.   Assessment:   1. 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 -Discussed benign skin lesions with patient and treatment options.  -Hyperkeratotic tissue was debrided with chisel without incident.  -Applied salycylic acid treatment to area with dressing. Advised to remove bandaging tomorrow.  -Encouraged daily moisturizing -Discussed use of pumice stone -Advised good supportive shoes and inserts -Patient to return to office as needed or sooner if condition worsens.   Jennefer Moats, DPM

## 2024-04-08 ENCOUNTER — Other Ambulatory Visit: Payer: Self-pay | Admitting: Podiatry

## 2024-04-08 ENCOUNTER — Ambulatory Visit (INDEPENDENT_AMBULATORY_CARE_PROVIDER_SITE_OTHER)

## 2024-04-08 ENCOUNTER — Ambulatory Visit: Admitting: Podiatry

## 2024-04-08 DIAGNOSIS — L84 Corns and callosities: Secondary | ICD-10-CM

## 2024-04-08 DIAGNOSIS — D2371 Other benign neoplasm of skin of right lower limb, including hip: Secondary | ICD-10-CM

## 2024-04-08 DIAGNOSIS — M79672 Pain in left foot: Secondary | ICD-10-CM

## 2024-04-08 NOTE — Progress Notes (Signed)
  Chief Complaint  Patient presents with   Callouses    Right foot pain    HPI: 48 y.o. 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  He states that he still gets buildup of the painful corn every 2 to 3 weeks.  He tries to sanded at home with an electric sander.  He notes that he is often limping at work and it is affecting his ability to work.  He does have custom orthotics through our office and they are with him today.  Past Medical History:  Diagnosis Date   Atrial fibrillation [facility])    Past Surgical History:  Procedure Laterality Date   ANKLE SURGERY     No Known Allergies   Physical Exam: Palpable pedal pulses noted.  No areas of edema or erythema are noted.  There is a focal hyperkeratotic lesion with pain on palpation right submet 5.  There is also a small callus right submet 1.  There is pain on palpation of the lesions.  No surrounding erythema or soft tissue breakdown is noted.  Radiographs of the right foot do show previous fifth metatarsal head resection.  No bony regrowth is noted   Assessment/Plan of Care: 1. Left foot pain   2. 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  1 application of Cantharone solution was applied to each lesion followed by an occlusive Band-Aid.  He can remove this in 4 hours.  Quarter inch felt padding was applied underneath his right orthotic to build up the area between the 2nd and 4th metatarsal heads to further offload the 1st and 5th metatarsal heads.  It appears that the orthotics are not offloading the areas enough to prevent recurrence and pain.  Will have him follow-up with our pedorthist in the near future to have this permanently incorporated into his orthotic with a more durable material so that he is  comfortable in his shoes.   Awanda CHARM Imperial, DPM, FACFAS [facility]     [address] [place], [place] [address]                Office [phone]  Fax [phone]

## 2024-05-08 ENCOUNTER — Telehealth: Payer: Self-pay | Admitting: Podiatry

## 2024-05-08 DIAGNOSIS — Z0271 Encounter for disability determination: Secondary | ICD-10-CM

## 2024-05-08 NOTE — Telephone Encounter (Signed)
 s/w pt and OOW 05/09/24-05/24/24 approx. Next appt 05/13/24 and may want to go back sooner. He will let me know. faxing Tasha [phone] and emailing him his copy to addr on file

## 2024-05-13 ENCOUNTER — Ambulatory Visit (INDEPENDENT_AMBULATORY_CARE_PROVIDER_SITE_OTHER): Admitting: Podiatry

## 2024-05-13 ENCOUNTER — Encounter: Payer: Self-pay | Admitting: Podiatry

## 2024-05-13 VITALS — BP 157/101 | HR 95

## 2024-05-13 DIAGNOSIS — D2371 Other benign neoplasm of skin of right lower limb, including hip: Secondary | ICD-10-CM

## 2024-05-13 NOTE — Progress Notes (Signed)
  Subjective:  Patient ID: Alfred Wyatt, male    DOB: 1976-09-25,   MRN: [id number]  Chief Complaint  Patient presents with   Callouses    It's the same problem I have had before, where I had surgery.  They put me on leave from my job.  I don't want to lose my job.    48 y.o. male presents for reoccurrence of lesion on right foot. Relates it has come back. Has had previous fifth metatarsal osteotomy on right. States it is affecting his job and unable to work. Had seen Dr. Awanda and she had added some extra padding. . Denies any other pedal complaints. Denies n/v/f/c.   Past Medical History:  Diagnosis Date   Atrial fibrillation (HCC)     Objective:  Physical Exam: Vascular: DP/PT pulses 2/4 bilateral. CFT <3 seconds. Normal hair growth on digits. No edema.  Skin. No lacerations or abrasions bilateral feet.  Hyperkeratotic cored lesion noted sub fifth metatarsal head with disruption of skin lines.  Musculoskeletal: MMT 5/5 bilateral lower extremities in DF, PF, Inversion and Eversion. Deceased ROM in DF of ankle joint.  Neurological: Sensation intact to light touch.   Assessment:   1. 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 -Discussed benign skin lesions with patient and treatment options.  -Hyperkeratotic tissue was debrided with chisel without incident.  -Applied salycylic acid treatment to area with dressing. Advised to remove bandaging tomorrow.  -Encouraged daily moisturizing -Discussed use of pumice stone -Advised good supportive shoes and inserts. Continue orthotics for offloadin -Discussed possible further resection in future of bone but do not know how successful this will be for him. This is a difficult problem that often times requires periodic debridement and treatment to manage and likely will not go away.  -Patient to return to office as needed or sooner if condition  worsens.   Asberry Failing, DPM

## 2024-05-15 ENCOUNTER — Telehealth: Payer: Self-pay | Admitting: Podiatry

## 2024-05-15 NOTE — Telephone Encounter (Signed)
 Recd [employer] fax, requesting notes/letter from 05/13/24-present. Faxed to [phone]

## 2024-05-20 ENCOUNTER — Telehealth: Payer: Self-pay | Admitting: Podiatry

## 2024-05-20 NOTE — Telephone Encounter (Signed)
 pt lft mess on my vmail. cld him bck and lft mess on his vmail that I faxed [employer] 05/15/24 but he had others call me and send to me

## 2024-05-21 ENCOUNTER — Encounter: Payer: Self-pay | Admitting: Podiatry

## 2024-05-21 ENCOUNTER — Telehealth: Payer: Self-pay | Admitting: Podiatry

## 2024-05-21 DIAGNOSIS — Z0271 Encounter for disability determination: Secondary | ICD-10-CM

## 2024-05-21 NOTE — Telephone Encounter (Signed)
 Faxed [employer] [phone] form/letter

## 2024-05-21 NOTE — Telephone Encounter (Signed)
 Recd fax from Tasha H to define light duty, see prev notes-letter. Refaxed new letter [phone]

## 2024-06-10 ENCOUNTER — Ambulatory Visit (INDEPENDENT_AMBULATORY_CARE_PROVIDER_SITE_OTHER): Admitting: Podiatry

## 2024-06-10 ENCOUNTER — Encounter: Payer: Self-pay | Admitting: Podiatry

## 2024-06-10 DIAGNOSIS — M65971 Unspecified synovitis and tenosynovitis, right ankle and foot: Secondary | ICD-10-CM | POA: Diagnosis not present

## 2024-06-10 MED ORDER — TRIAMCINOLONE ACETONIDE 10 MG/ML IJ SUSP
2.5000 mg | Freq: Once | INTRAMUSCULAR | Status: AC
  Administered 2024-06-10: 2.5 mg via INTRA_ARTICULAR

## 2024-06-10 MED ORDER — DEXAMETHASONE SODIUM PHOSPHATE 120 MG/30ML IJ SOLN
4.0000 mg | Freq: Once | INTRAMUSCULAR | Status: AC
  Administered 2024-06-10: 4 mg via INTRA_ARTICULAR

## 2024-06-10 NOTE — Progress Notes (Signed)
  Subjective:  Patient ID: Alfred Wyatt, male    DOB: 07-Apr-1976,   MRN: [id number]  No chief complaint on file.   48 y.o. male presents for reoccurrence of lesion on right foot. Relates it has come back. Has had previous fifth metatarsal osteotomy on right. States it is affecting his job and unable to work. Relates he is hping to get a shot today . Denies any other pedal complaints. Denies n/v/f/c.   Past Medical History:  Diagnosis Date   Atrial fibrillation (HCC)     Objective:  Physical Exam: Vascular: DP/PT pulses 2/4 bilateral. CFT <3 seconds. Normal hair growth on digits. No edema.  Skin. No lacerations or abrasions bilateral feet.  Hyperkeratotic cored lesion noted sub fifth metatarsal head with disruption of skin lines.  Musculoskeletal: MMT 5/5 bilateral lower extremities in DF, PF, Inversion and Eversion. Deceased ROM in DF of ankle joint.  Neurological: Sensation intact to light touch.   Assessment:   1. Synovitis of right foot        Plan:  Patient was evaluated and treated and all questions answered. -Discussed benign skin lesions with patient and treatment options.  -Hyperkeratotic tissue was debrided with chisel without incident.  -Applied salycylic acid treatment to area with dressing. Advised to remove bandaging tomorrow.  -Encouraged daily moisturizing -Discussed use of pumice stone -Advised good supportive shoes and inserts. Continue orthotics for offloadin -Discussed possible further resection in future of bone but do not know how successful this will be for him. This is a difficult problem that often times requires periodic debridement and treatment to manage and likely will not go away.  -Patient to return to office as needed or sooner if condition worsens.  Procedure: Injection Tendon/Ligament Discussed alternatives, risks, complications and verbal consent was obtained.  Location: Right fifth metatarsal phalangeal joint . Skin Prep:  Alcohol. Injectate: 1cc 0.5% marcaine plain, 1 cc dexamethasone  0.5 cc kenalog   Disposition: Patient tolerated procedure well. Injection site dressed with a band-aid.  Post-injection care was discussed and return precautions discussed.     Asberry Failing, DPM

## 2024-07-24 ENCOUNTER — Ambulatory Visit
Admission: EM | Admit: 2024-07-24 | Discharge: 2024-07-24 | Disposition: A | Discharge status: Home / Self Care | Attending: Family Medicine | Admitting: Family Medicine

## 2024-07-24 DIAGNOSIS — I10 Essential (primary) hypertension: Secondary | ICD-10-CM | POA: Diagnosis not present

## 2024-07-24 DIAGNOSIS — M545 Low back pain, unspecified: Secondary | ICD-10-CM | POA: Diagnosis not present

## 2024-07-24 DIAGNOSIS — M62830 Muscle spasm of back: Secondary | ICD-10-CM | POA: Diagnosis not present

## 2024-07-24 MED ORDER — KETOROLAC TROMETHAMINE 30 MG/ML IJ SOLN
30.0000 mg | Freq: Once | INTRAMUSCULAR | Status: AC
  Administered 2024-07-24: 30 mg via INTRAMUSCULAR

## 2024-07-24 MED ORDER — DEXAMETHASONE SOD PHOSPHATE PF 10 MG/ML IJ SOLN
10.0000 mg | Freq: Once | INTRAMUSCULAR | Status: AC
  Administered 2024-07-24: 10 mg via INTRAMUSCULAR

## 2024-07-24 MED ORDER — METOPROLOL SUCCINATE ER 25 MG PO TB24
25.0000 mg | ORAL_TABLET | Freq: Every day | ORAL | 2 refills | Status: AC
Start: 2024-07-24 — End: ?

## 2024-07-24 MED ORDER — CYCLOBENZAPRINE HCL 10 MG PO TABS: ORAL_TABLET | ORAL | 0 refills | Status: AC

## 2024-07-24 NOTE — ED Provider Notes (Addendum)
 [facility]   [id number] 07/24/24 Arrival Time: 9048  ASSESSMENT & PLAN:  1. Acute right-sided low back pain without sciatica   2. Spasm of muscle of lower back    Able to ambulate here and hemodynamically stable. No indication for imaging of back at this time given no trauma and normal neurological exam. Discussed.  Meds ordered this encounter  Medications   cyclobenzaprine  (FLEXERIL ) 10 MG tablet    Sig: Take 1 tablet by mouth 3 times daily as needed for muscle spasm. Warning: May cause drowsiness.    Dispense:  21 tablet    Refill:  0   dexamethasone  (DECADRON ) injection 10 mg   ketorolac (TORADOL) 30 MG/ML injection 30 mg   Work/school excuse note: provided. Medication sedation precautions given. Encourage ROM/movement as tolerated.  Recommend:  Follow-up Information     [facility]).   Specialty: Urgent Care Why: If worsening or failing to improve as anticipated. Contact information: [address] [place]  [address] [phone]               Also sent BP med in for him. Recommend PCP f/u.  Meds ordered this encounter  Medications   metoprolol  succinate (TOPROL -XL) 25 MG 24 hr tablet    Sig: Take 1 tablet (25 mg total) by mouth daily.    Dispense:  30 tablet    Refill:  2    Reviewed expectations re: course of current medical issues. Questions answered. Outlined signs and symptoms indicating need for more acute intervention. Patient verbalized understanding. After Visit Summary given.   SUBJECTIVE: History from: patient.  Alfred Wyatt is a 48 y.o. male who presents with complaint of R lower back pain; s/p lifting heavy object at work yesterday; felt something pull; more painful this am; normal bowel/bladder habits. Normal ambulation. Denies extremity sensation changes or weakness.    Increased blood pressure noted today. Reports that he has been treated  for hypertension in the past.  Not currently on meds; ran out.   OBJECTIVE:  Vitals:   07/24/24 1018 07/24/24 1019 07/24/24 1021  BP:  (!) 150/114 (!) 149/115  Pulse:  85   Resp:  20   Temp:  98.5 F (36.9 C)   TempSrc:  Oral   SpO2:  98%   Weight: 93 kg    Height: 6' 1 (1.854 m)      General appearance: alert; no distress HEENT: [place]; AT Neck: supple with FROM; without midline tenderness CV: regular Lungs: unlabored respirations; speaks full sentences without difficulty Abdomen: soft, non-tender; non-distended Back: moderate and well localized tenderness to palpation over R lumbar musculature; FROM at waist; bruising: none; without midline tenderness Extremities: without edema; symmetrical without gross deformities; normal ROM of bilateral LE Skin: warm and dry Neurologic: normal gait; normal sensation and strength of bilateral LE Psychological: alert and cooperative; normal mood and affect  Labs:  Labs Reviewed - No data to display  Imaging: No results found.  No Known Allergies  Past Medical History:  Diagnosis Date   Atrial fibrillation (HCC)    Social History   Socioeconomic History   Marital status: Significant Other    Spouse name: Not on file   Number of children: Not on file   Years of education: Not on file   Highest education level: Not on file  Occupational History   Not on file  Tobacco Use   Smoking status: Every Day    Current  packs/day: 0.50    Types: Cigarettes   Smokeless tobacco: Never   Tobacco comments:    1/2 ppd 09/25/23  Vaping Use   Vaping status: Former   Substances: Nicotine, Flavoring  Substance and Sexual Activity   Alcohol use: Yes    Alcohol/week: 6.0 standard drinks of alcohol    Types: 6 Standard drinks or equivalent per week    Comment: 6 mixed drinks weekly 09/25/23   Drug use: Not Currently   Sexual activity: Yes    Birth control/protection: Condom  Other Topics Concern   Not on file  Social History  Narrative   Not on file   Social Drivers of Health   Financial Resource Strain: Not on file  Food Insecurity: Not on file  Transportation Needs: Not on file  Physical Activity: Not on file  Stress: Not on file  Social Connections: Not on file  Intimate Partner Violence: Not on file   History reviewed. No pertinent family history. Past Surgical History:  Procedure Laterality Date   ANKLE SURGERY        Rolinda Rogue, MD 07/24/24 1041    Rolinda Rogue, MD 07/24/24 1053

## 2024-07-24 NOTE — ED Triage Notes (Signed)
 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 Since then, they have experienced severe pain in the right lower back. States the pain is significant. Has a history of similar back pain in the past, previously treated with an injection that provided relief. Incident has been reported to employer. Naproxen  tried with no relief.

## 2024-07-24 NOTE — Discharge Instructions (Addendum)
 Meds ordered this encounter  Medications   cyclobenzaprine  (FLEXERIL ) 10 MG tablet    Sig: Take 1 tablet by mouth 3 times daily as needed for muscle spasm. Warning: May cause drowsiness.    Dispense:  21 tablet    Refill:  0   dexamethasone  (DECADRON ) injection 10 mg   ketorolac (TORADOL) 30 MG/ML injection 30 mg   HOME CARE INSTRUCTIONS: For many people, back pain returns. Since low back pain is rarely dangerous, it is often a condition that people can learn to manage on their own. Please remain active. It is stressful on the back to sit or stand in one place. Do not sit, drive, or stand in one place for more than 30 minutes at a time. Take short walks on level surfaces as soon as pain allows. Try to increase the length of time you walk each day. Do not stay in bed. Resting more than 1 or 2 days can delay your recovery. Do not avoid exercise or work. Your body is made to move. It is not dangerous to be active, even though your back may hurt. Your back will likely heal faster if you return to being active before your pain is gone. Over-the-counter medicines to reduce pain and inflammation are often the most helpful.  SEEK MEDICAL CARE IF: You have pain that is not relieved with rest or medicine. You have pain that does not improve in 1 week. You have new symptoms. You are generally not feeling well.  SEEK IMMEDIATE MEDICAL CARE IF: You have pain that radiates from your back into your legs. You develop new bowel or bladder control problems. You have unusual weakness or numbness in your arms or legs. You develop nausea or vomiting. You develop abdominal pain. You feel faint.

## 2024-08-01 ENCOUNTER — Emergency Department

## 2024-08-01 ENCOUNTER — Emergency Department
Admission: EM | Admit: 2024-08-01 | Discharge: 2024-08-01 | Disposition: A | Discharge status: Home / Self Care | Payer: Worker's Compensation | Attending: Emergency Medicine | Admitting: Emergency Medicine

## 2024-08-01 ENCOUNTER — Other Ambulatory Visit: Payer: Self-pay

## 2024-08-01 DIAGNOSIS — Y990 Civilian activity done for income or pay: Secondary | ICD-10-CM | POA: Diagnosis not present

## 2024-08-01 DIAGNOSIS — W208XXA Other cause of strike by thrown, projected or falling object, initial encounter: Secondary | ICD-10-CM | POA: Diagnosis not present

## 2024-08-01 DIAGNOSIS — S0990XA Unspecified injury of head, initial encounter: Secondary | ICD-10-CM | POA: Diagnosis present

## 2024-08-01 MED ORDER — ACETAMINOPHEN 325 MG PO TABS
650.0000 mg | ORAL_TABLET | Freq: Once | ORAL | Status: AC
  Administered 2024-08-01: 650 mg via ORAL
  Filled 2024-08-01: qty 2
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
## 2024-08-01 NOTE — ED Notes (Signed)
 Pt advised : Employer did not request drug screen for WC

## 2024-08-01 NOTE — Discharge Instructions (Addendum)
 Your CT scans were normal.  Please practice brain rest as we discussed.  Return for any new, worsening, or change in symptoms or other concerns.

## 2024-09-02 ENCOUNTER — Ambulatory Visit: Admitting: Podiatry

## 2024-09-02 ENCOUNTER — Encounter: Payer: Self-pay | Admitting: Podiatry

## 2024-09-02 DIAGNOSIS — D2371 Other benign neoplasm of skin of right lower limb, including hip: Secondary | ICD-10-CM

## 2024-09-02 DIAGNOSIS — M65971 Unspecified synovitis and tenosynovitis, right ankle and foot: Secondary | ICD-10-CM

## 2024-09-02 NOTE — Progress Notes (Signed)
  Subjective:  Patient ID: Alfred Wyatt, male    DOB: 12/25/1975,   MRN: [id number]  Chief Complaint  Patient presents with   Foot Pain    It's not good, they took me out of work.  I need surgery.  That shot only works about a week.  They said I could end up losing my job.  I can't afford to lose my job.    48 y.o. male presents for reoccurrence of lesion on right foot.  Relates he has not been doing well.  This shot only helped for about a week.  He had to stop working and was seen by another podiatrist and they were unable to help him as well.  He is here hoping to make a plan for more surgery to fix the problem with his foot.. Denies any other pedal complaints. Denies n/v/f/c.   Past Medical History:  Diagnosis Date   Atrial fibrillation (HCC)     Objective:  Physical Exam: Vascular: DP/PT pulses 2/4 bilateral. CFT <3 seconds. Normal hair growth on digits. No edema.  Skin. No lacerations or abrasions bilateral feet.  Hyperkeratotic cored lesion noted sub fifth metatarsal head with disruption of skin lines.  Very tender to palpation. Musculoskeletal: MMT 5/5 bilateral lower extremities in DF, PF, Inversion and Eversion. Deceased ROM in DF of ankle joint.  Neurological: Sensation intact to light touch.   Assessment:   1. Synovitis of right foot   2. Benign neoplasm of skin of right foot        Plan:  Patient was evaluated and treated and all questions answered. -Discussed benign skin lesions with patient and treatment options.  -Hyperkeratotic tissue was debrided with chisel without incident.  -Applied salycylic acid treatment to area with dressing. Advised to remove bandaging tomorrow.  -Encouraged daily moisturizing -Discussed use of pumice stone -Advised good supportive shoes and inserts. Continue orthotics for offloadin -Discussed possible further resection in future of bone but do not know how successful this will be for him. This is a difficult problem that  often times requires periodic debridement and treatment to manage and likely will not go away.  Discussed at this time we can try further resection of the bone.  Discussed perioperative course in detail and risks of the procedure.  Patient is understanding that this may be a futile effort but would like to try anyways. -Informed surgical risk consent was reviewed and read aloud to the patient.  I reviewed the films.  I have discussed my findings with the patient in great detail.  I have discussed all risks including but not limited to infection, stiffness, scarring, limp, disability, deformity, damage to blood vessels and nerves, numbness, poor healing, need for braces, arthritis, chronic pain, amputation, death.  All benefits and realistic expectations discussed in great detail.  I have made no promises as to the outcome.  I have provided realistic expectations.  I have offered the patient a 2nd opinion, which they have declined and assured me they preferred to proceed despite the risks. Plan for surgery December 23. Postop meds: Zofran , oxycodone  5 325 mg   Asberry Failing, DPM

## 2024-09-09 ENCOUNTER — Telehealth: Payer: Self-pay | Admitting: Podiatry

## 2024-09-09 NOTE — Telephone Encounter (Signed)
 Recd fax from [facility] needing last office visit notes Faxed [phone] with note of RTW approx 10/15/24

## 2024-09-16 ENCOUNTER — Telehealth: Payer: Self-pay | Admitting: Podiatry

## 2024-09-16 NOTE — Telephone Encounter (Signed)
 Patient contacted office to see if he could schedule surgery for January. Patient is scheduled for 10/08/2024. Patient not on any blood thinners or GLP1 medications. Patient pharmacy is correct.

## 2024-09-26 ENCOUNTER — Ambulatory Visit
Admission: EM | Admit: 2024-09-26 | Discharge: 2024-09-26 | Disposition: A | Discharge status: Home / Self Care | Source: Home / Self Care

## 2024-09-26 ENCOUNTER — Encounter: Payer: Self-pay | Admitting: Emergency Medicine

## 2024-09-26 DIAGNOSIS — J0100 Acute maxillary sinusitis, unspecified: Secondary | ICD-10-CM

## 2024-09-26 DIAGNOSIS — M2010 Hallux valgus (acquired), unspecified foot: Secondary | ICD-10-CM | POA: Insufficient documentation

## 2024-09-26 DIAGNOSIS — M21969 Unspecified acquired deformity of unspecified lower leg: Secondary | ICD-10-CM | POA: Insufficient documentation

## 2024-09-26 DIAGNOSIS — L565 Disseminated superficial actinic porokeratosis (DSAP): Secondary | ICD-10-CM | POA: Insufficient documentation

## 2024-09-26 DIAGNOSIS — Q828 Other specified congenital malformations of skin: Secondary | ICD-10-CM | POA: Insufficient documentation

## 2024-09-26 MED ORDER — AMOXICILLIN-POT CLAVULANATE 875-125 MG PO TABS: 1.0000 | ORAL_TABLET | Freq: Two times a day (BID) | ORAL | 0 refills | Status: AC

## 2024-09-26 MED ORDER — PREDNISONE 50 MG PO TABS: ORAL_TABLET | ORAL | 0 refills | Status: AC

## 2024-09-26 NOTE — ED Triage Notes (Signed)
 Pt presents c/o sinus concern x about 9 days. Pt states,  I think I got a sinus infection. For about a week and a half I done had pressure under my eyes and in the back of my head. My nose stopped up to I can't even breathe out of it.  Pt denies any additional sxs.

## 2024-09-26 NOTE — ED Provider Notes (Signed)
 " [name]    CSN: [id number] Arrival date & time: 09/26/24  9081      History   Chief Complaint Chief Complaint  Patient presents with   Facial Pain    HPI Alfred Wyatt is a 49 y.o. male.   Pt with a hx of A. Fib, presents today due to sinus pain and pressure for the last 9 days. Pt states that she has been using Sudafed, Mucinex, and other OTC meds for symptoms with no relief. Pt admits to heavy drinking last night as well. Pt denies dizziness, shortness of breath, or chest pain.  The history is provided by the patient.    Past Medical History:  Diagnosis Date   Atrial fibrillation [facility])     Patient Active Problem List   Diagnosis Date Noted   Acquired deformity of lower leg 09/26/2024   Acquired hallux valgus 09/26/2024   Disseminated superficial actinic porokeratosis 09/26/2024   Porokeratosis 09/26/2024   Paroxysmal atrial fibrillation (HCC) 09/25/2023   Elevated blood-pressure reading without diagnosis of hypertension 09/18/2023   Pain in right shoulder 09/18/2023   Pain in joint of right shoulder 09/18/2023   Polyp of nasal cavity 06/30/2023   Allergic rhinitis 06/30/2023   Epistaxis 06/30/2023    Past Surgical History:  Procedure Laterality Date   ANKLE SURGERY         Home Medications    Prior to Admission medications  Medication Sig Start Date End Date Taking? Authorizing Provider  amoxicillin -clavulanate (AUGMENTIN ) 875-125 MG tablet Take 1 tablet by mouth every 12 (twelve) hours for 10 days. 09/26/24 10/06/24 Yes Andra Krabbe C, PA-C  ketoconazole (NIZORAL) 2 % cream 1 Application. 08/27/24 12/25/24 Yes [provider]  predniSONE (DELTASONE) 50 MG tablet Take 1 tab po daily for 5 days 09/26/24  Yes Andra Krabbe C, PA-C  cyclobenzaprine  (FLEXERIL ) 10 MG tablet Take 1 tablet by mouth 3 times daily as needed for muscle spasm. Warning: May cause drowsiness. 07/24/24   Rolinda Rogue, MD  metoprolol  succinate  (TOPROL -XL) 25 MG 24 hr tablet Take 1 tablet (25 mg total) by mouth daily. 07/24/24   Rolinda Rogue, MD  naproxen  (NAPRELAN ) 500 MG 24 hr tablet Take 500 mg by mouth daily with breakfast. Patient not taking: Reported on 09/02/2024    [provider]    Family History History reviewed. No pertinent family history.  Social History Social History[1]   Allergies   Patient has no known allergies.   Review of Systems Review of Systems   Physical Exam Triage Vital Signs ED Triage Vitals  Encounter Vitals Group     BP 09/26/24 0948 (!) 164/126     Girls Systolic BP Percentile --      Girls Diastolic BP Percentile --      Boys Systolic BP Percentile --      Boys Diastolic BP Percentile --      Pulse Rate 09/26/24 0948 (!) 117     Resp 09/26/24 0948 20     Temp 09/26/24 0948 99.2 F (37.3 C)     Temp src --      SpO2 09/26/24 0948 95 %     Weight 09/26/24 1009 205 lb 0.4 oz (93 kg)     Height --      Head Circumference --      Peak Flow --      Pain Score 09/26/24 1009 7     Pain Loc --      Pain Education --  Exclude from Growth Chart --    No data found.  Updated Vital Signs BP (!) 150/113   Pulse (!) 112   Temp 99.2 F (37.3 C)   Resp 20   Wt 205 lb 0.4 oz (93 kg)   SpO2 95%   BMI 27.05 kg/m   Visual Acuity Right Eye Distance:   Left Eye Distance:   Bilateral Distance:    Right Eye Near:   Left Eye Near:    Bilateral Near:     Physical Exam Vitals and nursing note reviewed.  Constitutional:      General: He is not in acute distress.    Appearance: Normal appearance. He is not ill-appearing, toxic-appearing or diaphoretic.  HENT:     Nose: Congestion (moderately enlarged turbinates) present. No rhinorrhea.     Right Sinus: Maxillary sinus tenderness present. No frontal sinus tenderness.     Left Sinus: Maxillary sinus tenderness present. No frontal sinus tenderness.     Comments: No tenderness to palpation    Mouth/Throat:     Mouth:  Mucous membranes are moist.     Pharynx: Oropharynx is clear. No oropharyngeal exudate or posterior oropharyngeal erythema.  Eyes:     General: No scleral icterus. Cardiovascular:     Rate and Rhythm: Normal rate and regular rhythm.     Heart sounds: Normal heart sounds.  Pulmonary:     Effort: Pulmonary effort is normal. No respiratory distress.     Breath sounds: Normal breath sounds. No wheezing or rhonchi.  Skin:    General: Skin is warm.  Neurological:     Mental Status: He is alert and oriented to person, place, and time.  Psychiatric:        Mood and Affect: Mood normal.        Behavior: Behavior normal.      UC Treatments / Results  Labs (all labs ordered are listed, but only abnormal results are displayed) Labs Reviewed - No data to display  EKG   Radiology No results found.  Procedures Procedures (including critical care time)  Medications Ordered in UC Medications - No data to display  Initial Impression / Assessment and Plan / UC Course  I have reviewed the triage vital signs and the nursing notes.  Pertinent labs & imaging results that were available during my care of the patient were reviewed by me and considered in my medical decision making (see chart for details).     Final Clinical Impressions(s) / UC Diagnoses   Final diagnoses:  Acute maxillary sinusitis, recurrence not specified     Discharge Instructions      You have been diagnosed with a sinus infection today, some are caused by viruses and others are caused by bacteria.  If your symptoms have been going on for less than 7 days it is most likely that you have a viral sinus infection.  Antibiotics will not work for this and it will have to run its course.  Sinus rinses (using a Nettie pot) or saline rinses are helpful as well as pseudoephedrine, and nasal sprays along with ibuprofen  and Tylenol  for pain.  If you have had your symptoms for more than 7 days you most likely have a bacterial  infection and will be prescribed antibiotics.  Supportive measures given for viral sinus infections will also be helpful for bacterial infections.  If you are using antibiotics you should start to feel better in 2 to 3 days but it is important that you complete  antibiotics in their entirety.     ED Prescriptions     Medication Sig Dispense Auth. Provider   amoxicillin -clavulanate (AUGMENTIN ) 875-125 MG tablet Take 1 tablet by mouth every 12 (twelve) hours for 10 days. 20 tablet Andra Krabbe C, PA-C   predniSONE (DELTASONE) 50 MG tablet Take 1 tab po daily for 5 days 5 tablet Andra Krabbe BROCKS, PA-C      PDMP not reviewed this encounter.    [1]  Social History Tobacco Use   Smoking status: Every Day    Current packs/day: 0.50    Types: Cigarettes    Passive exposure: Current   Smokeless tobacco: Never   Tobacco comments:    1/2 ppd 09/25/23  Vaping Use   Vaping status: Former   Substances: Nicotine, Flavoring  Substance Use Topics   Alcohol use: Yes    Alcohol/week: 6.0 standard drinks of alcohol    Types: 6 Standard drinks or equivalent per week    Comment: 6 mixed drinks weekly 09/25/23   Drug use: Not Currently     Andra Krabbe BROCKS, PA-C 09/26/24 1034  "

## 2024-09-26 NOTE — Discharge Instructions (Addendum)

## 2024-10-07 ENCOUNTER — Telehealth: Payer: Self-pay | Admitting: Podiatry

## 2024-10-07 NOTE — Telephone Encounter (Signed)
 DOS- 10/08/2024  5TH METATARSAL HEAD RES RT- 71886  [employer] EFFECTIVE DATE- 06/27/2023  DEDUCTIBLE- $1000 REMAINING- $1000 OOP- $9100 REMAINING- $2358.30 COINSURANCE- 0%  PER FAX RECEIVED FROM [employer] [place], PRIOR AUTH IS NOT REQUIRED FOR CPT CODE 71886.

## 2024-10-08 ENCOUNTER — Other Ambulatory Visit: Payer: Self-pay | Admitting: Podiatry

## 2024-10-08 DIAGNOSIS — M65971 Unspecified synovitis and tenosynovitis, right ankle and foot: Secondary | ICD-10-CM | POA: Diagnosis not present

## 2024-10-08 MED ORDER — OXYCODONE-ACETAMINOPHEN 5-325 MG PO TABS: 1.0000 | ORAL_TABLET | ORAL | 0 refills | Status: AC | PRN

## 2024-10-08 MED ORDER — ONDANSETRON HCL 4 MG PO TABS: 4.0000 mg | ORAL_TABLET | Freq: Three times a day (TID) | ORAL | 0 refills | Status: AC | PRN

## 2024-10-11 ENCOUNTER — Telehealth: Payer: Self-pay | Admitting: Podiatry

## 2024-10-11 DIAGNOSIS — Z0279 Encounter for issue of other medical certificate: Secondary | ICD-10-CM

## 2024-10-11 NOTE — Telephone Encounter (Signed)
 Faxed [facility] [phone] form/notes DOS 10/08/24 RTW approx 12/02/24 Fee is due at next appt.

## 2024-10-15 ENCOUNTER — Ambulatory Visit

## 2024-10-15 ENCOUNTER — Encounter: Payer: Self-pay | Admitting: Podiatry

## 2024-10-15 ENCOUNTER — Ambulatory Visit: Admitting: Podiatry

## 2024-10-15 DIAGNOSIS — M65971 Unspecified synovitis and tenosynovitis, right ankle and foot: Secondary | ICD-10-CM

## 2024-10-15 DIAGNOSIS — Z9889 Other specified postprocedural states: Secondary | ICD-10-CM

## 2024-10-15 NOTE — Progress Notes (Signed)
"  °  Subjective:  Patient ID: Alfred Wyatt, male    DOB: [birth year],  MRN: [id number]  Chief Complaint  Patient presents with   Routine Post Op    POV #1 DOS 10/08/2024 RT 5TH METATARSAL HEAD RES. It's fine.  I haven't been putting pressure on it.  My pain level is a five.  (No nausea, vomiting, chills or fever experienced)    DOS: 10/08/24 Procedure: Right fifth metatarsal head resection revision  49 y.o. male returns for POV#1. Doing well and managing pain   Review of Systems: Negative except as noted in the HPI. Denies N/V/F/Ch.  Past Medical History:  Diagnosis Date   Atrial fibrillation (HCC)    Current Medications[1]  Tobacco Use History[2]  Allergies[3] Objective:  There were no vitals filed for this visit. There is no height or weight on file to calculate BMI. Constitutional Well developed. Well nourished.  Vascular Foot warm and well perfused. Capillary refill normal to all digits.   Neurologic Normal speech. Oriented to person, place, and time. Epicritic sensation to light touch grossly present bilaterally.  Dermatologic Skin healing well without signs of infection. Skin edges well coapted without signs of infection.  Orthopedic: Tenderness to palpation noted about the surgical site.   Radiographs: Interval resection of additional distal fifth metatarsal head and shaft Assessment:   1. Post-operative state   2. Synovitis of right foot    Plan:  Patient was evaluated and treated and all questions answered.  S/p foot surgery right -Progressing as expected post-operatively. -WB Status: WBAT in Surgical shoe -Sutures: intact. -Medications: n/a Will have out of work until 2/17.  -Foot redressed.  Return in 2 weeks for suture removal   Return in about 2 weeks (around 10/29/2024) for post op.      [1]  Current Outpatient Medications:    metoprolol  succinate (TOPROL -XL) 25 MG 24 hr tablet, Take 1 tablet (25 mg total) by mouth daily., Disp: 30 tablet,  Rfl: 2   cyclobenzaprine  (FLEXERIL ) 10 MG tablet, Take 1 tablet by mouth 3 times daily as needed for muscle spasm. Warning: May cause drowsiness. (Patient not taking: Reported on 10/15/2024), Disp: 21 tablet, Rfl: 0   ketoconazole (NIZORAL) 2 % cream, 1 Application. (Patient not taking: Reported on 10/15/2024), Disp: , Rfl:    naproxen  (NAPRELAN ) 500 MG 24 hr tablet, Take 500 mg by mouth daily with breakfast. (Patient not taking: Reported on 10/15/2024), Disp: , Rfl:    ondansetron  (ZOFRAN ) 4 MG tablet, Take 1 tablet (4 mg total) by mouth every 8 (eight) hours as needed for nausea or vomiting. (Patient not taking: Reported on 10/15/2024), Disp: 20 tablet, Rfl: 0   predniSONE  (DELTASONE ) 50 MG tablet, Take 1 tab po daily for 5 days (Patient not taking: Reported on 10/15/2024), Disp: 5 tablet, Rfl: 0 [2]  Social History Tobacco Use  Smoking Status Every Day   Current packs/day: 0.50   Types: Cigarettes   Passive exposure: Current  Smokeless Tobacco Never  Tobacco Comments   1/2 ppd 09/25/23  [3] No Known Allergies  "

## 2024-10-29 ENCOUNTER — Encounter: Admitting: Podiatry
